# Patient Record
Sex: Female | Born: 1955
Health system: Southern US, Community
[De-identification: ages and names within clinical notes are randomized; demographics above are authoritative.]

## PROBLEM LIST (undated history)

## (undated) DIAGNOSIS — M1990 Unspecified osteoarthritis, unspecified site: Secondary | ICD-10-CM

## (undated) DIAGNOSIS — T4145XA Adverse effect of unspecified anesthetic, initial encounter: Secondary | ICD-10-CM

## (undated) DIAGNOSIS — Z8669 Personal history of other diseases of the nervous system and sense organs: Secondary | ICD-10-CM

## (undated) DIAGNOSIS — Z87442 Personal history of urinary calculi: Secondary | ICD-10-CM

## (undated) DIAGNOSIS — T8859XA Other complications of anesthesia, initial encounter: Secondary | ICD-10-CM

## (undated) DIAGNOSIS — E785 Hyperlipidemia, unspecified: Secondary | ICD-10-CM

## (undated) DIAGNOSIS — I10 Essential (primary) hypertension: Secondary | ICD-10-CM

## (undated) DIAGNOSIS — F329 Major depressive disorder, single episode, unspecified: Secondary | ICD-10-CM

## (undated) DIAGNOSIS — F32A Depression, unspecified: Secondary | ICD-10-CM

## (undated) DIAGNOSIS — B029 Zoster without complications: Secondary | ICD-10-CM

## (undated) DIAGNOSIS — L570 Actinic keratosis: Secondary | ICD-10-CM

## (undated) DIAGNOSIS — Z973 Presence of spectacles and contact lenses: Secondary | ICD-10-CM

## (undated) HISTORY — DX: Major depressive disorder, single episode, unspecified: F32.9

## (undated) HISTORY — PX: BREAST SURGERY: SHX581

## (undated) HISTORY — DX: Actinic keratosis: L57.0

## (undated) HISTORY — DX: Hyperlipidemia, unspecified: E78.5

## (undated) HISTORY — DX: Zoster without complications: B02.9

## (undated) HISTORY — DX: Depression, unspecified: F32.A

## (undated) HISTORY — DX: Essential (primary) hypertension: I10

---

## 2004-10-19 ENCOUNTER — Ambulatory Visit: Payer: Self-pay | Admitting: Unknown Physician Specialty

## 2005-11-07 ENCOUNTER — Ambulatory Visit: Payer: Self-pay | Admitting: Unknown Physician Specialty

## 2006-06-17 HISTORY — PX: REDUCTION MAMMAPLASTY: SUR839

## 2006-07-24 ENCOUNTER — Ambulatory Visit (HOSPITAL_BASED_OUTPATIENT_CLINIC_OR_DEPARTMENT_OTHER): Admission: RE | Admit: 2006-07-24 | Discharge: 2006-07-25 | Payer: Self-pay | Admitting: Plastic Surgery

## 2006-11-18 ENCOUNTER — Ambulatory Visit: Payer: Self-pay | Admitting: Unknown Physician Specialty

## 2007-02-09 ENCOUNTER — Ambulatory Visit: Payer: Self-pay | Admitting: Unknown Physician Specialty

## 2007-04-23 DIAGNOSIS — D239 Other benign neoplasm of skin, unspecified: Secondary | ICD-10-CM

## 2007-04-23 HISTORY — DX: Other benign neoplasm of skin, unspecified: D23.9

## 2007-07-21 LAB — HM COLONOSCOPY: HM Colonoscopy: NORMAL

## 2007-11-19 ENCOUNTER — Ambulatory Visit: Payer: Self-pay | Admitting: Unknown Physician Specialty

## 2008-06-17 HISTORY — PX: CHOLECYSTECTOMY: SHX55

## 2008-12-21 ENCOUNTER — Ambulatory Visit: Payer: Self-pay | Admitting: Unknown Physician Specialty

## 2010-01-01 ENCOUNTER — Ambulatory Visit: Payer: Self-pay | Admitting: Unknown Physician Specialty

## 2010-08-03 ENCOUNTER — Ambulatory Visit: Payer: Self-pay | Admitting: Obstetrics and Gynecology

## 2010-08-12 LAB — HM PAP SMEAR: HM Pap smear: NORMAL

## 2010-11-19 ENCOUNTER — Ambulatory Visit: Payer: Self-pay | Admitting: Obstetrics and Gynecology

## 2011-01-21 ENCOUNTER — Ambulatory Visit: Payer: Self-pay | Admitting: Unknown Physician Specialty

## 2012-03-19 LAB — HM PAP SMEAR: HM Pap smear: NORMAL

## 2012-03-21 LAB — HM PAP SMEAR: HM PAP: NORMAL

## 2012-04-27 ENCOUNTER — Ambulatory Visit: Payer: Self-pay | Admitting: Physician Assistant

## 2012-05-04 LAB — IFOBT (OCCULT BLOOD): IFOBT: NEGATIVE

## 2012-07-20 ENCOUNTER — Encounter: Payer: Self-pay | Admitting: Internal Medicine

## 2012-07-20 ENCOUNTER — Ambulatory Visit (INDEPENDENT_AMBULATORY_CARE_PROVIDER_SITE_OTHER): Payer: Self-pay | Admitting: Internal Medicine

## 2012-07-20 VITALS — BP 112/74 | HR 72 | Temp 98.1°F | Ht 62.25 in | Wt 155.0 lb

## 2012-07-20 DIAGNOSIS — I10 Essential (primary) hypertension: Secondary | ICD-10-CM

## 2012-07-20 DIAGNOSIS — F329 Major depressive disorder, single episode, unspecified: Secondary | ICD-10-CM

## 2012-07-20 DIAGNOSIS — R251 Tremor, unspecified: Secondary | ICD-10-CM

## 2012-07-20 DIAGNOSIS — E785 Hyperlipidemia, unspecified: Secondary | ICD-10-CM

## 2012-07-20 DIAGNOSIS — R259 Unspecified abnormal involuntary movements: Secondary | ICD-10-CM

## 2012-07-20 DIAGNOSIS — F32A Depression, unspecified: Secondary | ICD-10-CM

## 2012-07-20 NOTE — Assessment & Plan Note (Addendum)
Left sided resting tremor. Tremor seems more consistent with benign essential tremor in my opinion than Parkinson's, however there has been some debate about this per her report. Will request records on previous evaluation. Will set up evaluation with Dr. Arbutus Leas in neurology.

## 2012-07-20 NOTE — Assessment & Plan Note (Signed)
Symptoms well controlled with Cymbalta. Will continue.

## 2012-07-20 NOTE — Progress Notes (Signed)
Subjective:    Patient ID: Jennifer Gardner, female    DOB: Nov 06, 1955, 57 y.o.   MRN: [id number]  HPI 57YO female with h/o tremor left hand, hypertension, hyperlipidemia, depression presents to establish care. Doing well. Reports full compliance with meds. Symptoms of depressed mood started after death of her father. Improved with use of Cymbalta.  Notes that she was evaluated by local neurologist for left hand tremor. Initially treated with Primidone with some improvement, but recently told she may actually have Parkinson's.  Was scheduled for additional testing and instructed to change to Sinemet, but has held off on this. Notes occasional resting tremor left hand. Denies change in gait, strength, or any focal symptoms of numbness or other change in sensation. Maternal uncle had Parkinson's.  Outpatient Encounter Prescriptions as of 07/20/2012  Medication Sig Dispense Refill  . acyclovir (ZOVIRAX) 400 MG tablet Take 400 mg by mouth 2 (two) times daily.      . DULoxetine (CYMBALTA) 30 MG capsule Take 30 mg by mouth daily.      Marland Kitchen estradiol (VIVELLE-DOT) 0.05 MG/24HR Place 1 patch onto the skin 2 (two) times a week.      . primidone (MYSOLINE) 250 MG tablet Take 250 mg by mouth 3 (three) times daily.      . progesterone (PROMETRIUM) 100 MG capsule Take 100 mg by mouth daily.      . rosuvastatin (CRESTOR) 20 MG tablet Take 20 mg by mouth daily.      Marland Kitchen triamterene-hydrochlorothiazide (MAXZIDE-25) 37.5-25 MG per tablet Take 1 tablet by mouth daily.       BP 112/74  Pulse 72  Temp 98.1 F (36.7 C) (Oral)  Ht 5' 2.25" (1.581 m)  Wt 155 lb (70.308 kg)  BMI 28.12 kg/m2  SpO2 96%  Review of Systems  Constitutional: Negative for fever, chills, appetite change, fatigue and unexpected weight change.  HENT: Negative for ear pain, congestion, sore throat, trouble swallowing, neck pain, voice change and sinus pressure.   Eyes: Negative for visual disturbance.  Respiratory: Negative for cough, shortness of  breath, wheezing and stridor.   Cardiovascular: Negative for chest pain, palpitations and leg swelling.  Gastrointestinal: Negative for nausea, vomiting, abdominal pain, diarrhea, constipation, blood in stool, abdominal distention and anal bleeding.  Genitourinary: Negative for dysuria and flank pain.  Musculoskeletal: Negative for myalgias, arthralgias and gait problem.  Skin: Negative for color change and rash.  Neurological: Positive for tremors. Negative for dizziness and headaches.  Hematological: Negative for adenopathy. Does not bruise/bleed easily.  Psychiatric/Behavioral: Negative for suicidal ideas, sleep disturbance and dysphoric mood. The patient is not nervous/anxious.        Objective:   Physical Exam  Constitutional: She is oriented to person, place, and time. She appears well-developed and well-nourished. No distress.  HENT:  Head: Normocephalic and atraumatic.  Right Ear: External ear normal.  Left Ear: External ear normal.  Nose: Nose normal.  Mouth/Throat: Oropharynx is clear and moist. No oropharyngeal exudate.  Eyes: Conjunctivae normal are normal. Pupils are equal, round, and reactive to light. Right eye exhibits no discharge. Left eye exhibits no discharge. No scleral icterus.  Neck: Normal range of motion. Neck supple. No tracheal deviation present. No thyromegaly present.  Cardiovascular: Normal rate, regular rhythm, normal heart sounds and intact distal pulses.  Exam reveals no gallop and no friction rub.   No murmur heard. Pulmonary/Chest: Effort normal and breath sounds normal. No respiratory distress. She has no wheezes. She has no rales. She exhibits no  tenderness.  Abdominal: Soft. Bowel sounds are normal. She exhibits no distension. There is no tenderness. There is no rebound and no guarding.  Musculoskeletal: Normal range of motion. She exhibits no edema and no tenderness.  Lymphadenopathy:    She has no cervical adenopathy.  Neurological: She is alert  and oriented to person, place, and time. She has normal strength. She displays tremor (left hand). She displays no atrophy. No cranial nerve deficit or sensory deficit. She exhibits normal muscle tone. Coordination and gait normal.  Skin: Skin is warm and dry. No rash noted. She is not diaphoretic. No erythema. No pallor.  Psychiatric: She has a normal mood and affect. Her behavior is normal. Judgment and thought content normal.          Assessment & Plan:

## 2012-07-20 NOTE — Assessment & Plan Note (Signed)
BP Readings from Last 3 Encounters:  07/20/12 112/74   BP well controlled with current medications. Will continue. Will request results on recent renal function.

## 2012-07-20 NOTE — Assessment & Plan Note (Signed)
Will request records on recent CMP and lipids performed by previous PCP. Follow up 1 month.

## 2012-07-31 ENCOUNTER — Ambulatory Visit: Payer: Self-pay | Admitting: Neurology

## 2012-08-06 ENCOUNTER — Encounter: Payer: Self-pay | Admitting: Neurology

## 2012-08-06 ENCOUNTER — Ambulatory Visit (INDEPENDENT_AMBULATORY_CARE_PROVIDER_SITE_OTHER): Payer: 59 | Admitting: Neurology

## 2012-08-06 VITALS — BP 98/60 | HR 76 | Temp 98.1°F | Resp 18 | Wt 155.0 lb

## 2012-08-06 DIAGNOSIS — G250 Essential tremor: Secondary | ICD-10-CM

## 2012-08-06 DIAGNOSIS — F32A Depression, unspecified: Secondary | ICD-10-CM

## 2012-08-06 DIAGNOSIS — F329 Major depressive disorder, single episode, unspecified: Secondary | ICD-10-CM

## 2012-08-06 DIAGNOSIS — G252 Other specified forms of tremor: Secondary | ICD-10-CM

## 2012-08-06 DIAGNOSIS — G251 Drug-induced tremor: Secondary | ICD-10-CM

## 2012-08-06 MED ORDER — DULOXETINE HCL 20 MG PO CPEP: 20.0000 mg | ORAL_CAPSULE | Freq: Every day | ORAL | Status: DC

## 2012-08-06 MED ORDER — FLUOXETINE HCL 10 MG PO CAPS: 10.0000 mg | ORAL_CAPSULE | Freq: Every day | ORAL | Status: DC

## 2012-08-06 NOTE — Patient Instructions (Addendum)
1.  Decrease cymbalta to 20 mg daily for a month and then stop the medication 2.  Start prozac 10 mg daily.  Call me in a month and let me know if I need to increase the dosage. 3.  I will see you back in 3 months. 4.  Continue with the primidone for now. 5.  Have fun at the beach!

## 2012-08-06 NOTE — Progress Notes (Signed)
Jennifer Gardner was seen today in the movement disorders clinic for neurologic consultation at the request of Shelia Media, MD.  The consultation is for the evaluation of tremor.  She previously saw Dr. Sherryll Burger in [place].  I do not have those records.  The patient reports that she has had tremor for 1 1/2 to 2 years.  She first noted it in her L hand but she occasionally notices it in her R.  She has some quivering in the mouth as well.   She was started on primidone and over the years has worked up to her current 250 mg tid.  She went back to Dr. Sherryll Burger b/c she felt that the primidone was not working as well as it previously had.  When she went back, he thought that she had transitioned from ET to PD.  It was recommended that she try levodopa.  She did this, but did not find it helpful.  She believes that she was on it twice per day and was on it for 90 days.  It was subsequently recommended that she have a DAT scan performed in December 2013.  This turned out to be quite expensive and she was not sure that she wanted to do it, so it has not been done.  The patient states that tremor is worse in the AM.  It is worse with caffeine.  She drinks 1/2 caffeine coffee and she drinks 2 mugs of that in the AM.  She does drink unsweet tea but notices no difference with that.  She does note that a BC powder will make it worse.  Alcohol may improve tremor.  She has a maternal uncle with PD and her uncles son has tremor but it is not PD.  She notices the tremor worse when she is moving the arms.  She notices the mouth tremor with eating and her friends have pointed it out to her.  She is able to eat soup or peas without trouble.  Stress may increase the tremor.  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  She wonders if the cymbalta contributes to the sx.  She tried to wean off of it but felt bad.  She wonders if she could just transition to another antidepressant.  She used  prozac in the past with some success.      Specific Symptoms:  Tremor: yes Voice: no change Sleep: sleeps well (nocturia x 1)  Vivid Dreams:  yes but always has been  Acting out dreams:  no Wet Pillows: no Postural symptoms:  no  Falls?  no Bradykinesia symptoms: no problems Loss of smell:  no Loss of taste:  no Urinary Incontinence:  no Difficulty Swallowing:  no Handwriting, micrographia: no Trouble with ADL's:  no  Trouble buttoning clothing: no Depression:  no, not while on antidepressant Memory changes:  no Hallucinations:  no  visual distortions: no N/V:  no Lightheaded:  no  Syncope: no Diplopia:  no Dyskinesia:  no  Neuroimaging has not previously been performed.    PREVIOUS MEDICATIONS: Sinemet and primidone  ALLERGIES:   Allergies  Allergen Reactions  . Penicillins Hives  . Sulfa Antibiotics Hives    CURRENT MEDICATIONS:  Current Outpatient Prescriptions on File Prior to Visit  Medication Sig Dispense Refill  . acyclovir (ZOVIRAX) 400 MG tablet Take 400 mg by mouth 2 (two) times daily.      . DULoxetine (CYMBALTA) 30 MG capsule Take  30 mg by mouth daily.      Marland Kitchen estradiol (VIVELLE-DOT) 0.05 MG/24HR Place 1 patch onto the skin 2 (two) times a week.      . primidone (MYSOLINE) 250 MG tablet Take 250 mg by mouth 3 (three) times daily.      . progesterone (PROMETRIUM) 100 MG capsule Take 100 mg by mouth daily.      . rosuvastatin (CRESTOR) 20 MG tablet Take 20 mg by mouth daily.      Marland Kitchen triamterene-hydrochlorothiazide (MAXZIDE-25) 37.5-25 MG per tablet Take 1 tablet by mouth daily.       No current facility-administered medications on file prior to visit.    PAST MEDICAL HISTORY:   Past Medical History  Diagnosis Date  . Hyperlipidemia   . Hypertension   . Depression     after death of father 10/31/04  . Shingles     back    PAST SURGICAL HISTORY:   Past Surgical History  Procedure Laterality Date  . Breast surgery      breast reduction  . Foot  surgery      Dr. Orland Jarred    SOCIAL HISTORY:   History   Social History  . Marital Status: Married    Spouse Name: N/A    Number of Children: N/A  . Years of Education: N/A   Occupational History  . Not on file.   Social History Main Topics  . Smoking status: Never Smoker   . Smokeless tobacco: Never Used  . Alcohol Use: Yes     Comment: occassionally  . Drug Use: No  . Sexually Active: Not on file   Other Topics Concern  . Not on file   Social History Narrative   Lives in [place] with husband. Dog in home.      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      Diet - regular      Exercise - 5 days per week    FAMILY HISTORY:   Family Status  Relation Status Death Age  . Mother Alive   . Father Deceased   . Sister Alive   . Maternal Uncle Deceased     ROS:  A complete 10 system review of systems was obtained and was unremarkable apart from what is mentioned above.  PHYSICAL EXAMINATION:    VITALS:   Filed Vitals:   08/06/12 1253  BP: 98/60  Pulse: 76  Temp: 98.1 F (36.7 C)  Resp: 18  Weight: 155 lb (70.308 kg)    GEN:  The patient appears stated age and is in NAD. HEENT:  Normocephalic, atraumatic.  The mucous membranes are moist. The superficial temporal arteries are without ropiness or tenderness. CV:  RRR Lungs:  CTAB Neck/HEME:  There are no carotid bruits bilaterally.  Neurological examination:  Orientation: The patient is alert and oriented x3. Fund of knowledge is appropriate.  Recent and remote memory are intact.  Attention and concentration are normal.    Able to name objects and repeat phrases. Cranial nerves: There is good facial symmetry. Pupils are equal round and reactive to light bilaterally. Fundoscopic exam reveals clear margins bilaterally.  There are no square wave jerks.  Extraocular muscles are intact. The visual fields are full to confrontational testing. The speech is fluent  and clear. Soft palate rises symmetrically and there is no tongue deviation. Hearing is intact to conversational tone. Sensation: Sensation is intact to light and pinprick throughout (facial, trunk, extremities).  Vibration is intact at the bilateral big toe. There is no extinction with double simultaneous stimulation. There is no sensory dermatomal level identified. Motor: Strength is 5/5 in the bilateral upper and lower extremities.   Shoulder shrug is equal and symmetric.  There is no pronator drift.  There are no fasciculations. Deep tendon reflexes: Deep tendon reflexes are 2/4 at the bilateral biceps, triceps, brachioradialis, patella and achilles. Plantar responses are downgoing bilaterally.  Movement examination: Tone: There is normal tone in the bilateral upper extremities.  The tone in the lower extremities is normal.  Abnormal movements: There is a rare, intermittent resting tremor of the LUE.  There is no postural or kinetic tremor.  There is a chin tremor that is prominent when she is concentrating on a task.  She is able to draw concentric circles without significant difficulty.  She can pour water (full glass) from one cup to another without trouble. Coordination:  There is mild decremation with RAM's, seen most prominently with hand opening/closing on the L.  Heel taps and toe taps were good. Gait and Station: The patient has no difficulty arising out of a deep-seated chair without the use of the hands. The patient's stride length is normal.  The patient has a negative pull test.     She ambulates in a tandem fashion without significan trouble.      ASSESSMENT/PLAN:  1.  Tremor.  -Like the patient, I wonder if this is not from cymbalta.  Albeit rare, I have seen cymbalta cause parkinsonism and specifically tremor.  I would like to see her off the medication.  She is agreeable.  We will decrease it to 20 mg daily for one month and then discontinue the medication.  Simultaneously, we  will start Prozac 10 mg daily for mood.  She is to call me in a month to see if I need to increase that dosage.  Risks, benefits, side effects and alternative therapies were discussed.  The opportunity to ask questions was given and they were answered to the best of my ability.  The patient expressed understanding and willingness to follow the outlined treatment protocols.  -for now, I will continue her on the primidone but she is on a very large dosage.  Eventually, I may want to see how she looks on a decreased dosage, but will wait until the Cymbalta is discontinued.  She does tell me that she had recent lab work done at the [facility] and I will try to get a copy.  -I told her that right now I do not want to proceed with a DAT scan.  We can address this in the future, if need be, but first I think I would like to have an MRI before we ever considered DAT scan.  Again, we will hold on these things for now. 2.   I will plan on seeing the patient back in the next 3 months, but encouraged her to call me should any questions or concerns arise before that time.

## 2012-08-26 ENCOUNTER — Telehealth: Payer: Self-pay | Admitting: Internal Medicine

## 2012-08-26 NOTE — Telephone Encounter (Signed)
Pt came in to request refills.  Per pt list:   Maxide 37.5 1 a day Progesterone 100 mg 1 a day Acyclovir 400 mg 2x day Primidone 250 mg 3x day  Please call in refills to [facility] mail order [phone]

## 2012-08-31 MED ORDER — TRIAMTERENE-HCTZ 37.5-25 MG PO TABS: 1.0000 | ORAL_TABLET | Freq: Every day | ORAL | Status: DC

## 2012-08-31 MED ORDER — ACYCLOVIR 400 MG PO TABS: 400.0000 mg | ORAL_TABLET | Freq: Two times a day (BID) | ORAL | Status: DC

## 2012-08-31 MED ORDER — PROGESTERONE MICRONIZED 100 MG PO CAPS: 100.0000 mg | ORAL_CAPSULE | Freq: Every day | ORAL | Status: DC

## 2012-08-31 MED ORDER — PRIMIDONE 250 MG PO TABS: 250.0000 mg | ORAL_TABLET | Freq: Three times a day (TID) | ORAL | Status: DC

## 2012-08-31 NOTE — Telephone Encounter (Signed)
Refills sent

## 2012-09-11 ENCOUNTER — Telehealth: Payer: Self-pay

## 2012-09-11 NOTE — Telephone Encounter (Signed)
Pt in the process of weaning they cymbalta and starting prozac.  When she stopped the cymbalta she "bottomed out"  No energy, depressed, went back on the 20mg  for a couple of days and is now cut back to about 10mg  of cymbalta and increased her prozac to 20mg  and is feeling much better, she is going to cut out the cymbalta this week.  She wanted you to be aware of this.

## 2012-10-15 ENCOUNTER — Ambulatory Visit (INDEPENDENT_AMBULATORY_CARE_PROVIDER_SITE_OTHER): Payer: 59 | Admitting: Neurology

## 2012-10-15 ENCOUNTER — Encounter: Payer: Self-pay | Admitting: Neurology

## 2012-10-15 VITALS — BP 110/68 | HR 72 | Temp 98.1°F | Resp 12 | Ht 62.0 in | Wt 153.0 lb

## 2012-10-15 DIAGNOSIS — F32A Depression, unspecified: Secondary | ICD-10-CM

## 2012-10-15 DIAGNOSIS — F329 Major depressive disorder, single episode, unspecified: Secondary | ICD-10-CM

## 2012-10-15 DIAGNOSIS — R251 Tremor, unspecified: Secondary | ICD-10-CM

## 2012-10-15 DIAGNOSIS — R259 Unspecified abnormal involuntary movements: Secondary | ICD-10-CM

## 2012-10-15 MED ORDER — FLUOXETINE HCL 20 MG PO CAPS: 20.0000 mg | ORAL_CAPSULE | Freq: Every day | ORAL | Status: DC

## 2012-10-15 MED ORDER — PROPRANOLOL HCL ER 80 MG PO CP24: 80.0000 mg | ORAL_CAPSULE | Freq: Every day | ORAL | Status: DC

## 2012-10-15 NOTE — Patient Instructions (Signed)
1.  Start inderal LA - 80 mg daily.  Call me if you have problems or don't feel good on the medication 2.  Call me if you need to go back on cymbalta or want to increase the prozac

## 2012-10-15 NOTE — Progress Notes (Signed)
Jennifer Gardner was seen today in the movement disorders clinic for neurologic f/u.  Her PCP is Wynona Dove, MD.  She is f/u for tremor.  She previously saw Dr. Sherryll Burger in [place].  I do not have those records.  The patient reports that she has had tremor for 1 1/2 to 2 years.  She first noted it in her L hand but she occasionally notices it in her R.  She has some quivering in the mouth as well.   She was started on primidone and over the years has worked up to her current 250 mg tid.  She went back to Dr. Sherryll Burger b/c she felt that the primidone was not working as well as it previously had.  When she went back, he thought that she had transitioned from ET to PD.  It was recommended that she try levodopa.  She did this, but did not find it helpful.  She believes that she was on it twice per day and was on it for 90 days.  It was subsequently recommended that she have a DAT scan performed in December 2013.  This turned out to be quite expensive and she was not sure that she wanted to do it, so it has not been done.  The patient states that tremor is worse in the AM.  It is worse with caffeine.  She drinks 1/2 caffeine coffee and she drinks 2 mugs of that in the AM.  She does drink unsweet tea but notices no difference with that.  She does note that a BC powder will make it worse.  Alcohol may improve tremor.  She has a maternal uncle with PD and her uncles son has tremor but it is not PD.  She notices the tremor worse when she is moving the arms.  She notices the mouth tremor with eating and her friends have pointed it out to her.  She is able to eat soup or peas without trouble.  Stress may increase the tremor.  Last visit, we weaned her off the cymbalta.  She initially had increasing depression with this and we slowed down the wean and increased the prozac.  She still doesn't feel as good as she did on the cymbalta but does not want to go back to it yet.  She has the same amount of tremor.    Specific  Symptoms:  Tremor: yes Voice: no change Sleep: sleeps well (nocturia x 1)  Vivid Dreams:  yes but always has been  Acting out dreams:  no Wet Pillows: no Postural symptoms:  no  Falls?  no Bradykinesia symptoms: no problems Loss of smell:  no Loss of taste:  no Urinary Incontinence:  no Difficulty Swallowing:  no Handwriting, micrographia: no Trouble with ADL's:  no  Trouble buttoning clothing: no Depression:  no, not while on antidepressant Memory changes:  no Hallucinations:  no  visual distortions: no N/V:  no Lightheaded:  no  Syncope: no Diplopia:  no Dyskinesia:  no  Neuroimaging has not previously been performed.    PREVIOUS MEDICATIONS: Sinemet and primidone  ALLERGIES:   Allergies  Allergen Reactions  . Penicillins Hives  . Sulfa Antibiotics Hives    CURRENT MEDICATIONS:  Current Outpatient Prescriptions on File Prior to Visit  Medication Sig Dispense Refill  . acyclovir (ZOVIRAX) 400 MG tablet Take 1 tablet (400 mg total) by mouth 2 (two) times daily.  180 tablet  0  . DULoxetine (CYMBALTA) 20 MG capsule Take 1 capsule (20 mg  total) by mouth daily.  30 capsule  0  . DULoxetine (CYMBALTA) 30 MG capsule Take 30 mg by mouth daily.      Marland Kitchen estradiol (VIVELLE-DOT) 0.05 MG/24HR Place 1 patch onto the skin 2 (two) times a week.      Marland Kitchen FLUoxetine (PROZAC) 10 MG capsule Take 1 capsule (10 mg total) by mouth daily.  30 capsule  3  . primidone (MYSOLINE) 250 MG tablet Take 1 tablet (250 mg total) by mouth 3 (three) times daily.  270 tablet  0  . progesterone (PROMETRIUM) 100 MG capsule Take 1 capsule (100 mg total) by mouth daily.  90 capsule  0  . rosuvastatin (CRESTOR) 20 MG tablet Take 20 mg by mouth daily.      Marland Kitchen triamterene-hydrochlorothiazide (MAXZIDE-25) 37.5-25 MG per tablet Take 1 each (1 tablet total) by mouth daily.  90 tablet  0   No current facility-administered medications on file prior to visit.    PAST MEDICAL HISTORY:   Past Medical History   Diagnosis Date  . Hyperlipidemia   . Hypertension   . Depression     after death of father 11/02/04  . Shingles     back    PAST SURGICAL HISTORY:   Past Surgical History  Procedure Laterality Date  . Breast surgery      breast reduction  . Foot surgery      Dr. Orland Jarred    SOCIAL HISTORY:   History   Social History  . Marital Status: Married    Spouse Name: N/A    Number of Children: N/A  . Years of Education: N/A   Occupational History  . Retired     [employer]   Social History Main Topics  . Smoking status: Never Smoker   . Smokeless tobacco: Never Used  . Alcohol Use: Yes     Comment: occassionally  . Drug Use: No  . Sexually Active: Not on file   Other Topics Concern  . Not on file   Social History Narrative   Lives in [place] with husband. Dog in home.      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      Diet - regular      Exercise - 5 days per week    FAMILY HISTORY:   Family Status  Relation Status Death Age  . Mother Alive     healthy  . Father Deceased     CHF  . Sister Alive     alive  . Maternal Uncle Deceased     PD    ROS:  A complete 10 system review of systems was obtained and was unremarkable apart from what is mentioned above.  PHYSICAL EXAMINATION:    VITALS:   Filed Vitals:   10/15/12 0803  BP: 110/68  Pulse: 72  Temp: 98.1 F (36.7 C)  Resp: 12  Height: 5\' 2"  (1.575 m)  Weight: 153 lb (69.4 kg)    GEN:  The patient appears stated age and is in NAD. HEENT:  Normocephalic, atraumatic.  The mucous membranes are moist. The superficial temporal arteries are without ropiness or tenderness. CV:  RRR Lungs:  CTAB Neck/HEME:  There are no carotid bruits bilaterally.  Neurological examination:  Orientation: The patient is alert and oriented x3. Fund of knowledge is appropriate.  Recent and remote memory are intact.  Attention and concentration are normal.    Able to name  objects and repeat phrases. Cranial  nerves: There is good facial symmetry. Pupils are equal round and reactive to light bilaterally. Fundoscopic exam reveals clear margins bilaterally.  There are no square wave jerks.  Extraocular muscles are intact. The visual fields are full to confrontational testing. The speech is fluent and clear. Soft palate rises symmetrically and there is no tongue deviation. Hearing is intact to conversational tone. Sensation: Sensation is intact to light and pinprick throughout (facial, trunk, extremities). Vibration is intact at the bilateral big toe. There is no extinction with double simultaneous stimulation. There is no sensory dermatomal level identified. Motor: Strength is 5/5 in the bilateral upper and lower extremities.   Shoulder shrug is equal and symmetric.  There is no pronator drift.  There are no fasciculations. Deep tendon reflexes: Deep tendon reflexes are 2/4 at the bilateral biceps, triceps, brachioradialis, patella and achilles. Plantar responses are downgoing bilaterally.  Movement examination: Tone: There is normal tone in the bilateral upper extremities.  The tone in the lower extremities is normal.  Abnormal movements: There is a rare, intermittent resting tremor of the LUE.  There is no postural or kinetic tremor.  There is a chin tremor that is prominent when she is concentrating on a task.  She is able to draw concentric circles without significant difficulty.  She can pour water (full glass) from one cup to another without trouble. Coordination:  There is mild decremation with RAM's, seen most prominently with hand opening/closing on the L.  Heel taps and toe taps were good. Gait and Station: The patient has no difficulty arising out of a deep-seated chair without the use of the hands. The patient's stride length is normal.  The patient has a negative pull test.     She ambulates in a tandem fashion without significant trouble.       ASSESSMENT/PLAN:  1.  Tremor.  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   -I am going to add propranolol.  Risks, benefits, side effects and alternative therapies were discussed.  The opportunity to ask questions was given and they were answered to the best of my ability.  The patient expressed understanding and willingness to follow the outlined treatment protocols.  -for now, I will continue her on the primidone but she is on a very large dosage.  Eventually, I may want to see how she looks on a decreased dosage, but will wait until the Cymbalta is discontinued.  She does tell me that she had recent lab work done at the [facility] and I will try to get a copy.  -I told her that right now I do not want to proceed with a DAT scan.  We can address this in the future, if need be, but first I think I would like to have an MRI before we ever considered DAT scan.  Again, we will hold on these things for now. 2.   Depression.  -She doesn't want to go back to the cymbalta yet and doesn't wish to increase prozac but she will let me know if she changes her mind.   3.  I will plan on seeing the patient back in the next 3 months, but encouraged her to call me should any questions or concerns arise before that time.

## 2012-10-23 ENCOUNTER — Telehealth: Payer: Self-pay

## 2012-10-23 NOTE — Telephone Encounter (Signed)
Pt calling stated she had a bad reaction to the propranolol.  She was on it for four days and her bp dropped pretty low 70's/40's.  She stopped taking them several days ago and is feeling some better.  She said you mentioned some other meds she could try for tremor.  For now she is just continuing on the primidone.

## 2012-10-25 NOTE — Telephone Encounter (Signed)
Tell her lets try the non long acting form of propranolol and we will start at a much lower dose.  Call in propranolol - 20 mg q day for 1 week and then go to bid and then call me after being on it for 2 weeks, sooner if there are problems.

## 2012-10-26 MED ORDER — PROPRANOLOL HCL 20 MG PO TABS: 20.0000 mg | ORAL_TABLET | Freq: Two times a day (BID) | ORAL | Status: DC

## 2012-10-26 NOTE — Telephone Encounter (Signed)
Pt notified, she will start when she gets back in town this Thursday and let us know how she does.

## 2012-11-02 ENCOUNTER — Telehealth: Payer: Self-pay

## 2012-11-02 NOTE — Telephone Encounter (Signed)
IF she is on only prozac - 10 mg then do the following:  Start cymbalta - 30 mg daily and 3 weeks after that d/c the prozac.

## 2012-11-02 NOTE — Telephone Encounter (Signed)
Pt has been on Prozac 20mg  for about two weeks now.  How would you like her to change?

## 2012-11-02 NOTE — Telephone Encounter (Signed)
Pt tried lower does of the propranolol, but still felt very fatigued afterward.  Has also changed from cymbalta to prozac and did not notice any change in her tremor.  She thinks she does better on the cymbalta and would like to go back to it.  Needs to know how to d/c the prozac and restart cymbalta.  She thinks she could try the propranolol again once she is back on the cymbalta.  She just felt better over all on the cymbalta.

## 2012-11-03 MED ORDER — FLUOXETINE HCL 10 MG PO CAPS: 10.0000 mg | ORAL_CAPSULE | Freq: Every day | ORAL | Status: DC

## 2012-11-03 NOTE — Telephone Encounter (Signed)
Pt aware, she only has prozac 20mg  caps so I will send in three weeks worth of 10mg .

## 2012-11-03 NOTE — Telephone Encounter (Signed)
Decrease it to 10 mg now and follow the protocol as I had otherwise given you.  Does she have some 10 mg prozac to titrate down (I think that is the dose she has).  thx

## 2012-11-12 ENCOUNTER — Telehealth: Payer: Self-pay | Admitting: *Deleted

## 2012-11-12 ENCOUNTER — Other Ambulatory Visit: Payer: Self-pay | Admitting: Internal Medicine

## 2012-11-12 NOTE — Telephone Encounter (Signed)
Left message to call back in reference to her prescription refill request.

## 2012-11-13 NOTE — Telephone Encounter (Signed)
Rx went to [facility]

## 2012-12-21 ENCOUNTER — Ambulatory Visit (INDEPENDENT_AMBULATORY_CARE_PROVIDER_SITE_OTHER): Payer: 59 | Admitting: Adult Health

## 2012-12-21 ENCOUNTER — Encounter: Payer: Self-pay | Admitting: Adult Health

## 2012-12-21 VITALS — BP 102/68 | HR 81 | Temp 99.2°F | Resp 12 | Wt 155.0 lb

## 2012-12-21 DIAGNOSIS — J029 Acute pharyngitis, unspecified: Secondary | ICD-10-CM

## 2012-12-21 LAB — POCT RAPID STREP A (OFFICE): Rapid Strep A Screen: NEGATIVE

## 2012-12-21 MED ORDER — CLARITHROMYCIN 250 MG PO TABS: 250.0000 mg | ORAL_TABLET | Freq: Two times a day (BID) | ORAL | Status: DC

## 2012-12-21 NOTE — Assessment & Plan Note (Signed)
Symptoms ongoing greater than one week. Start clarithromycin 250 mg twice a day x10 days. Chloraseptic spray or lozenges for sore throat. Stay hydrated. RTC if symptoms are not improved within 3-4 days.

## 2012-12-21 NOTE — Progress Notes (Signed)
  Subjective:    Patient ID: Jennifer Gardner, female    DOB: January 13, 1956, 57 y.o.   MRN: [id number]  HPI  Patient presents to clinic with cough and fever, congestion. Symptoms began on Friday. She has tried loratidine and ibuprofen for her fever. Highest fever report is 100. She reports multiple contact with strep within the last couple of weeks.   Current Outpatient Prescriptions on File Prior to Visit  Medication Sig Dispense Refill  . acyclovir (ZOVIRAX) 400 MG tablet Take 1 tablet by mouth 2  times daily.  180 tablet  3  . DULoxetine (CYMBALTA) 30 MG capsule Take 30 mg by mouth daily.      Marland Kitchen estradiol (VIVELLE-DOT) 0.05 MG/24HR Place 1 patch onto the skin 2 (two) times a week.      . primidone (MYSOLINE) 250 MG tablet Take 1 tablet  by mouth 3  times daily.  270 tablet  3  . progesterone (PROMETRIUM) 100 MG capsule Take 1 capsule by mouth  daily.  90 capsule  3  . rosuvastatin (CRESTOR) 20 MG tablet Take 20 mg by mouth daily.      Marland Kitchen triamterene-hydrochlorothiazide (MAXZIDE-25) 37.5-25 MG per tablet Take 1 tablet by mouth  daily  90 tablet  3   No current facility-administered medications on file prior to visit.     Review of Systems  Constitutional: Positive for fever and chills.  HENT: Positive for congestion, sore throat, postnasal drip and sinus pressure.   Respiratory: Positive for cough. Negative for wheezing.   Cardiovascular: Negative.     BP 102/68  Pulse 81  Temp(Src) 99.2 F (37.3 C) (Oral)  Resp 12  Wt 155 lb (70.308 kg)  BMI 28.34 kg/m2  SpO2 97%    Objective:   Physical Exam  Constitutional: She is oriented to person, place, and time. She appears well-developed and well-nourished. No distress.  HENT:  Head: Normocephalic and atraumatic.  Right Ear: External ear normal.  Left Ear: External ear normal.  Eyes: Conjunctivae are normal. Pupils are equal, round, and reactive to light.  Neck: Normal range of motion. Neck supple.  Cardiovascular: Normal rate,  regular rhythm, normal heart sounds and intact distal pulses.  Exam reveals no gallop and no friction rub.   No murmur heard. Pulmonary/Chest: Effort normal and breath sounds normal. No respiratory distress. She has no wheezes. She has no rales.  Musculoskeletal: Normal range of motion. She exhibits no edema.  Lymphadenopathy:    She has cervical adenopathy.  Neurological: She is alert and oriented to person, place, and time.  Skin: Skin is warm and dry.  Psychiatric: She has a normal mood and affect. Her behavior is normal. Judgment and thought content normal.       Assessment & Plan:

## 2012-12-21 NOTE — Patient Instructions (Addendum)
  Start clarithromycin 250 mg twice a day for 10 days.  You can try chloraseptic spray to soothe the throat or the lozenges.  Drink plenty of liquids to stay hydrated.  Tylenol or ibuprofen for fever and general discomfort.

## 2012-12-25 ENCOUNTER — Other Ambulatory Visit: Payer: Self-pay | Admitting: Internal Medicine

## 2012-12-28 ENCOUNTER — Other Ambulatory Visit: Payer: Self-pay | Admitting: *Deleted

## 2012-12-29 MED ORDER — ACYCLOVIR 400 MG PO TABS: ORAL_TABLET | ORAL | Status: DC

## 2012-12-29 NOTE — Telephone Encounter (Signed)
Pt states [facility] does not have Rx refill, even though it was confirmed receipt 11/12/12 with multiple refills. Rx sent to pharmacy by escript

## 2013-01-01 ENCOUNTER — Telehealth: Payer: Self-pay | Admitting: *Deleted

## 2013-01-01 MED ORDER — ACYCLOVIR 400 MG PO TABS: ORAL_TABLET | ORAL | Status: DC

## 2013-01-01 NOTE — Telephone Encounter (Signed)
Patient called stating [facility] stated they never received the prescription for her Acyclovir. 30 day supply sent to local pharmacy and once Dr. Dan Humphreys returns would like a 90 day script mailer to her at her home address on file.

## 2013-01-05 MED ORDER — ACYCLOVIR 400 MG PO TABS: ORAL_TABLET | ORAL | Status: DC

## 2013-01-05 NOTE — Telephone Encounter (Signed)
Printed to be signed and mailed

## 2013-01-06 ENCOUNTER — Ambulatory Visit: Payer: 59 | Admitting: Neurology

## 2013-02-01 ENCOUNTER — Telehealth: Payer: Self-pay | Admitting: Internal Medicine

## 2013-02-01 MED ORDER — DULOXETINE HCL 30 MG PO CPEP: 30.0000 mg | ORAL_CAPSULE | Freq: Every day | ORAL | Status: DC

## 2013-02-01 NOTE — Telephone Encounter (Signed)
Patient needs her cmbolta 30 mg called into [facility] on [address] [place] for a 30 day supply she normally uses [facility] who needs a new prescription sent to them as well for 90 day supply.

## 2013-02-01 NOTE — Telephone Encounter (Signed)
Is it ok for refills on Cymbalta?

## 2013-02-01 NOTE — Telephone Encounter (Signed)
Fine to call in these prescriptions

## 2013-02-01 NOTE — Telephone Encounter (Signed)
Rx sent to both pharmacies  

## 2013-02-24 ENCOUNTER — Telehealth: Payer: Self-pay | Admitting: Neurology

## 2013-02-24 NOTE — Telephone Encounter (Signed)
Pt calling to see if perhaps she can come off the primidone.  She doesn't think it is helping much and she thinks it may possibly be leading to ledderhose in her feet and dupytrene's in her hands.  She has a podiatry appt this coming Monday.  But she read where primidone can sometimes be a cause of these conditions.

## 2013-03-01 NOTE — Telephone Encounter (Signed)
I think that we will need an office visit to properly discuss and titrate.  She is on a huge dose of that med and it cannot be stopped cold [place].  Can she come in?

## 2013-03-02 NOTE — Telephone Encounter (Signed)
Left message for Pt to call and schedule an office visit to discuss medication issues.

## 2013-03-05 ENCOUNTER — Ambulatory Visit (INDEPENDENT_AMBULATORY_CARE_PROVIDER_SITE_OTHER): Payer: 59 | Admitting: Neurology

## 2013-03-05 VITALS — BP 112/68 | HR 60 | Temp 97.6°F | Resp 20 | Wt 150.7 lb

## 2013-03-05 DIAGNOSIS — F329 Major depressive disorder, single episode, unspecified: Secondary | ICD-10-CM

## 2013-03-05 DIAGNOSIS — F32A Depression, unspecified: Secondary | ICD-10-CM

## 2013-03-05 DIAGNOSIS — R251 Tremor, unspecified: Secondary | ICD-10-CM

## 2013-03-05 DIAGNOSIS — R259 Unspecified abnormal involuntary movements: Secondary | ICD-10-CM

## 2013-03-05 MED ORDER — PRIMIDONE 50 MG PO TABS: 100.0000 mg | ORAL_TABLET | Freq: Every day | ORAL | Status: DC

## 2013-03-05 NOTE — Addendum Note (Signed)
Addended by: Sadie Haber D on: 03/05/2013 10:34 AM   Modules accepted: Orders

## 2013-03-05 NOTE — Patient Instructions (Addendum)
1.  We will get your DaT scan scheduled.  Call Sarah if you don't have the date/time for that in the next 2-3 weeks 2.  Decrease primidone as follows:  100 mg daily for 1 week, then 50 mg daily for 2 weeks and then stop the medication 3.  Make a follow up appointment for late January or early February on your way out today. Thank You!

## 2013-03-05 NOTE — Progress Notes (Signed)
Jennifer Gardner was seen today in the movement disorders clinic for neurologic f/u.  Her PCP is Wynona Dove, MD.  She is f/u for tremor.  She previously saw Dr. Sherryll Burger in [place].  I do not have those records.  The patient reports that she has had tremor for 1 1/2 to 2 years.  She first noted it in her L hand but she occasionally notices it in her R.  She has some quivering in the mouth as well.   She was started on primidone and over the years has worked up to her current 250 mg tid.  She went back to Dr. Sherryll Burger b/c she felt that the primidone was not working as well as it previously had.  When she went back, he thought that she had transitioned from ET to PD.  It was recommended that she try levodopa.  She did this, but did not find it helpful.  She believes that she was on it twice per day and was on it for 90 days.  It was subsequently recommended that she have a DAT scan performed in December 2013.  This turned out to be quite expensive and she was not sure that she wanted to do it, so it has not been done.  The patient states that tremor is worse in the AM.  It is worse with caffeine.  She drinks 1/2 caffeine coffee and she drinks 2 mugs of that in the AM.  She does drink unsweet tea but notices no difference with that.  She does note that a BC powder will make it worse.  Alcohol may improve tremor.  She has a maternal uncle with PD and her uncles son has tremor but it is not PD.  She notices the tremor worse when she is moving the arms.  She notices the mouth tremor with eating and her friends have pointed it out to her.  She is able to eat soup or peas without trouble.  Stress may increase the tremor.  Last visit, we weaned her off the cymbalta.  She initially had increasing depression with this and we slowed down the wean and increased the prozac.  She still doesn't feel as good as she did on the cymbalta but does not want to go back to it yet.  She has the same amount of tremor.   03/05/13  update:  Last visit, propranolol was added.  However, this caused her blood pressure to drop.  The patient did call because we had changed her Cymbalta to Prozac as she thought that this causes tremor.  However, once she got off of the Cymbalta, the tremor was no better, depression was worse and she ultimately went back to the Cymbalta.  Today, she states that she would like to go off of the primidone.  She thinks that it contributes or causes her Dupuytren's contractures.  She was on a very large dosage of primidone at 250 mg 3 times per day and she self weaned it and is now on 250 mg once per day.  She noted no change in the tremor.  The tremor is most bothersome in the AM.  Today she notes that while she knew her maternal uncle had PD, his sons (her cousin) dx was recently changed to PD.  Neuroimaging has not previously been performed.    PREVIOUS MEDICATIONS: Sinemet and primidone  ALLERGIES:   Allergies  Allergen Reactions  . Penicillins Hives  . Sulfa Antibiotics Hives    CURRENT MEDICATIONS:  Current Outpatient Prescriptions on File Prior to Visit  Medication Sig Dispense Refill  . acyclovir (ZOVIRAX) 400 MG tablet Take 1 tablet by mouth 2  times daily.  180 tablet  1  . DULoxetine (CYMBALTA) 30 MG capsule Take 1 capsule (30 mg total) by mouth daily.  90 capsule  0  . estradiol (VIVELLE-DOT) 0.05 MG/24HR Place 1 patch onto the skin 2 (two) times a week.      . primidone (MYSOLINE) 250 MG tablet Take 1 tablet  by mouth 3  times daily.  270 tablet  3  . progesterone (PROMETRIUM) 100 MG capsule Take 1 capsule by mouth  daily.  90 capsule  3  . rosuvastatin (CRESTOR) 20 MG tablet Take 20 mg by mouth daily.      Marland Kitchen triamterene-hydrochlorothiazide (MAXZIDE-25) 37.5-25 MG per tablet Take 1 tablet by mouth  daily  90 tablet  3   No current facility-administered medications on file prior to visit.    PAST MEDICAL HISTORY:   Past Medical History  Diagnosis Date  . Hyperlipidemia   .  Hypertension   . Depression     after death of father 2004/11/09  . Shingles     back    PAST SURGICAL HISTORY:   Past Surgical History  Procedure Laterality Date  . Breast surgery      breast reduction  . Foot surgery      Dr. Orland Jarred    SOCIAL HISTORY:   History   Social History  . Marital Status: Married    Spouse Name: N/A    Number of Children: N/A  . Years of Education: N/A   Occupational History  . Retired     [employer]   Social History Main Topics  . Smoking status: Never Smoker   . Smokeless tobacco: Never Used  . Alcohol Use: Yes     Comment: occassionally  . Drug Use: No  . Sexual Activity: Not on file   Other Topics Concern  . Not on file   Social History Narrative   Lives in [place] with husband. Dog in home.      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      Diet - regular      Exercise - 5 days per week    FAMILY HISTORY:   Family Status  Relation Status Death Age  . Mother Alive     healthy  . Father Deceased     CHF  . Sister Alive     alive  . Maternal Uncle Deceased     PD    ROS:  A complete 10 system review of systems was obtained and was unremarkable apart from what is mentioned above.  PHYSICAL EXAMINATION:    VITALS:   Filed Vitals:   03/05/13 0909  BP: 112/68  Pulse: 60  Temp: 97.6 F (36.4 C)  Resp: 20  Weight: 150 lb 11.2 oz (68.357 kg)    GEN:  The patient appears stated age and is in NAD. HEENT:  Normocephalic, atraumatic.  The mucous membranes are moist. The superficial temporal arteries are without ropiness or tenderness. CV:  RRR Lungs:  CTAB Neck/HEME:  There are no carotid bruits bilaterally.  Neurological examination:  Orientation: The patient is alert and oriented x3. Fund of knowledge is appropriate.  Recent and remote memory are intact.  Attention and concentration are normal.    Able to name objects and repeat phrases. Cranial nerves: There is  good  facial symmetry. Pupils are equal round and reactive to light bilaterally. Fundoscopic exam reveals clear margins bilaterally.  There are no square wave jerks.  Extraocular muscles are intact. The visual fields are full to confrontational testing. The speech is fluent and clear. Soft palate rises symmetrically and there is no tongue deviation. Hearing is intact to conversational tone. Sensation: Sensation is intact to light and pinprick throughout (facial, trunk, extremities). Vibration is intact at the bilateral big toe. There is no extinction with double simultaneous stimulation. There is no sensory dermatomal level identified. Motor: Strength is 5/5 in the bilateral upper and lower extremities.   Shoulder shrug is equal and symmetric.  There is no pronator drift.  There are no fasciculations. Deep tendon reflexes: Deep tendon reflexes are 2/4 at the bilateral biceps, triceps, brachioradialis, patella and achilles. Plantar responses are downgoing bilaterally.  Movement examination: Tone: There is normal tone in the bilateral upper extremities.  The tone in the lower extremities is normal.  Abnormal movements: There is no tremor today.  There is no postural or kinetic tremor.  There is a chin tremor that is prominent when she is concentrating on a task.  She is able to draw concentric circles without significant difficulty.  She can pour water (full glass) from one cup to another without trouble. Coordination:  There is mild decremation with RAM's, seen most prominently with hand opening/closing, alternating supination/pronation of the forearm and finger taps on the L.  Heel taps and toe taps were good. Gait and Station: The patient has no difficulty arising out of a deep-seated chair without the use of the hands. The patient's stride length is normal.  The patient has a negative pull test.     She ambulates in a tandem fashion without significant trouble.      ASSESSMENT/PLAN:  1.  Tremor.  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   -She wants to d/c the primidone and I gave her a weaning schedule.  -I gave her several options (medication options as well) and we ultimately decided to go ahead and get the DaT scan (at [facility]).  If negative, then we should try artane or topamax, but she has little to no tremor today. 2.   Depression.  -She is doing well back on the cymbalta 3.  I will plan on seeing the patient back in the next 4 months, but encouraged her to call me should any questions or concerns arise before that time.

## 2013-04-21 LAB — HM MAMMOGRAPHY

## 2013-04-26 ENCOUNTER — Telehealth: Payer: Self-pay | Admitting: Internal Medicine

## 2013-04-26 NOTE — Telephone Encounter (Signed)
Pt called to schedule cpx and stated she needs refill on crestor 20mg  take 1 daily [facility] rx Pt has 3 weeks left,

## 2013-04-27 MED ORDER — ROSUVASTATIN CALCIUM 20 MG PO TABS: 20.0000 mg | ORAL_TABLET | Freq: Every day | ORAL | Status: DC

## 2013-04-27 NOTE — Telephone Encounter (Signed)
Medication was sent to pharmacy with notation patient must keep scheduled appointment.

## 2013-05-12 ENCOUNTER — Ambulatory Visit: Payer: Self-pay | Admitting: Internal Medicine

## 2013-06-03 ENCOUNTER — Encounter: Payer: Self-pay | Admitting: Internal Medicine

## 2013-06-17 HISTORY — PX: FOOT SURGERY: SHX648

## 2013-06-21 ENCOUNTER — Encounter: Payer: Self-pay | Admitting: Internal Medicine

## 2013-06-21 ENCOUNTER — Ambulatory Visit (INDEPENDENT_AMBULATORY_CARE_PROVIDER_SITE_OTHER): Payer: 59 | Admitting: Internal Medicine

## 2013-06-21 VITALS — BP 110/72 | HR 71 | Temp 98.4°F | Ht 62.25 in | Wt 159.0 lb

## 2013-06-21 DIAGNOSIS — Z0000 Encounter for general adult medical examination without abnormal findings: Secondary | ICD-10-CM | POA: Insufficient documentation

## 2013-06-21 DIAGNOSIS — I10 Essential (primary) hypertension: Secondary | ICD-10-CM

## 2013-06-21 DIAGNOSIS — E785 Hyperlipidemia, unspecified: Secondary | ICD-10-CM

## 2013-06-21 DIAGNOSIS — F329 Major depressive disorder, single episode, unspecified: Secondary | ICD-10-CM

## 2013-06-21 DIAGNOSIS — F3289 Other specified depressive episodes: Secondary | ICD-10-CM

## 2013-06-21 DIAGNOSIS — F32A Depression, unspecified: Secondary | ICD-10-CM

## 2013-06-21 DIAGNOSIS — R259 Unspecified abnormal involuntary movements: Secondary | ICD-10-CM

## 2013-06-21 DIAGNOSIS — R251 Tremor, unspecified: Secondary | ICD-10-CM

## 2013-06-21 LAB — CBC WITH DIFFERENTIAL/PLATELET
BASOS ABS: 0 10*3/uL (ref 0.0–0.1)
Basophils Relative: 0.5 % (ref 0.0–3.0)
Eosinophils Absolute: 0.1 10*3/uL (ref 0.0–0.7)
Eosinophils Relative: 1.9 % (ref 0.0–5.0)
HCT: 42 % (ref 36.0–46.0)
Hemoglobin: 13.9 g/dL (ref 12.0–15.0)
LYMPHS ABS: 2.3 10*3/uL (ref 0.7–4.0)
Lymphocytes Relative: 39.7 % (ref 12.0–46.0)
MCHC: 33.2 g/dL (ref 30.0–36.0)
MCV: 87.7 fl (ref 78.0–100.0)
MONOS PCT: 6.9 % (ref 3.0–12.0)
Monocytes Absolute: 0.4 10*3/uL (ref 0.1–1.0)
Neutro Abs: 2.9 10*3/uL (ref 1.4–7.7)
Neutrophils Relative %: 51 % (ref 43.0–77.0)
PLATELETS: 243 10*3/uL (ref 150.0–400.0)
RBC: 4.79 Mil/uL (ref 3.87–5.11)
RDW: 13.1 % (ref 11.5–14.6)
WBC: 5.7 10*3/uL (ref 4.5–10.5)

## 2013-06-21 LAB — COMPREHENSIVE METABOLIC PANEL
ALT: 23 U/L (ref 0–35)
AST: 24 U/L (ref 0–37)
Albumin: 4.4 g/dL (ref 3.5–5.2)
Alkaline Phosphatase: 41 U/L (ref 39–117)
BILIRUBIN TOTAL: 0.5 mg/dL (ref 0.3–1.2)
BUN: 14 mg/dL (ref 6–23)
CALCIUM: 9 mg/dL (ref 8.4–10.5)
CHLORIDE: 101 meq/L (ref 96–112)
CO2: 29 mEq/L (ref 19–32)
Creatinine, Ser: 0.6 mg/dL (ref 0.4–1.2)
GFR: 111.67 mL/min (ref >60.00)
Glucose, Bld: 92 mg/dL (ref 70–99)
Potassium: 3.9 mEq/L (ref 3.5–5.1)
Sodium: 137 mEq/L (ref 135–145)
Total Protein: 6.8 g/dL (ref 6.0–8.3)

## 2013-06-21 LAB — LIPID PANEL
CHOL/HDL RATIO: 3
Cholesterol: 188 mg/dL (ref 0–200)
HDL: 64.7 mg/dL (ref >39.00)
LDL CALC: 103 mg/dL — AB (ref 0–99)
TRIGLYCERIDES: 100 mg/dL (ref 0.0–149.0)
VLDL: 20 mg/dL (ref 0.0–40.0)

## 2013-06-21 LAB — TSH: TSH: 0.84 u[IU]/mL (ref 0.35–5.50)

## 2013-06-21 MED ORDER — ACYCLOVIR 400 MG PO TABS: ORAL_TABLET | ORAL | Status: DC

## 2013-06-21 MED ORDER — ESTRADIOL 0.05 MG/24HR TD PTTW: 1.0000 | MEDICATED_PATCH | TRANSDERMAL | Status: DC

## 2013-06-21 MED ORDER — DULOXETINE HCL 30 MG PO CPEP: 30.0000 mg | ORAL_CAPSULE | Freq: Every day | ORAL | Status: DC

## 2013-06-21 MED ORDER — ROSUVASTATIN CALCIUM 20 MG PO TABS: 20.0000 mg | ORAL_TABLET | Freq: Every day | ORAL | Status: DC

## 2013-06-21 MED ORDER — PROGESTERONE MICRONIZED 100 MG PO CAPS: ORAL_CAPSULE | ORAL | Status: DC

## 2013-06-21 MED ORDER — TRIAMTERENE-HCTZ 37.5-25 MG PO TABS: ORAL_TABLET | ORAL | Status: DC

## 2013-06-21 NOTE — Assessment & Plan Note (Signed)
BP Readings from Last 3 Encounters:  06/21/13 110/72  03/05/13 112/68  12/21/12 102/68   BP well controlled on current medications. Will continue.

## 2013-06-21 NOTE — Assessment & Plan Note (Signed)
Will check lipids and LFTs with labs today. Continue Crestor. 

## 2013-06-21 NOTE — Progress Notes (Signed)
Subjective:    Patient ID: Jennifer Gardner, female    DOB: [birth year], 58 y.o.   MRN: [id number]  HPI 58 year old female with history of hypertension, hyperlipidemia and tremor presents for annual exam. She reports she is generally feeling well. No concerns today. She notes that she was seen by neurologist and question still remains whether her tremors secondary to benign essential tremor versus parkinsonism. She is scheduled to undergo DAT scan at [facility] tomorrow. She then has scheduled followup with her neurologist.  Outpatient Prescriptions Prior to Visit  Medication Sig Dispense Refill  . acyclovir (ZOVIRAX) 400 MG tablet Take 1 tablet by mouth 2  times daily.  180 tablet  1  . DULoxetine (CYMBALTA) 30 MG capsule Take 1 capsule (30 mg total) by mouth daily.  90 capsule  0  . estradiol (VIVELLE-DOT) 0.05 MG/24HR Place 1 patch onto the skin 2 (two) times a week.      . progesterone (PROMETRIUM) 100 MG capsule Take 1 capsule by mouth  daily.  90 capsule  3  . rosuvastatin (CRESTOR) 20 MG tablet Take 1 tablet (20 mg total) by mouth daily.  90 tablet  0  . triamterene-hydrochlorothiazide (MAXZIDE-25) 37.5-25 MG per tablet Take 1 tablet by mouth  daily  90 tablet  3  . primidone (MYSOLINE) 50 MG tablet Take 2 tablets (100 mg total) by mouth at bedtime.  60 tablet  2   No facility-administered medications prior to visit.   BP 110/72  Pulse 71  Temp(Src) 98.4 F (36.9 C) (Oral)  Ht 5' 2.25" (1.581 m)  Wt 159 lb (72.122 kg)  BMI 28.85 kg/m2  SpO2 97%  Review of Systems  Constitutional: Negative for fever, chills, appetite change, fatigue and unexpected weight change.  HENT: Negative for congestion, ear pain, sinus pressure, sore throat, trouble swallowing and voice change.   Eyes: Negative for visual disturbance.  Respiratory: Negative for cough, shortness of breath, wheezing and stridor.   Cardiovascular: Negative for chest pain, palpitations and leg swelling.  Gastrointestinal:  Negative for nausea, vomiting, abdominal pain, diarrhea, constipation, blood in stool, abdominal distention and anal bleeding.  Genitourinary: Negative for dysuria and flank pain.  Musculoskeletal: Negative for arthralgias, gait problem, myalgias and neck pain.  Skin: Negative for color change and rash.  Neurological: Positive for tremors. Negative for dizziness and headaches.  Hematological: Negative for adenopathy. Does not bruise/bleed easily.  Psychiatric/Behavioral: Negative for suicidal ideas, sleep disturbance and dysphoric mood. The patient is not nervous/anxious.        Objective:   Physical Exam  Constitutional: She is oriented to person, place, and time. She appears well-developed and well-nourished. No distress.  HENT:  Head: Normocephalic and atraumatic.  Right Ear: External ear normal.  Left Ear: External ear normal.  Nose: Nose normal.  Mouth/Throat: Oropharynx is clear and moist. No oropharyngeal exudate.  Eyes: Conjunctivae are normal. Pupils are equal, round, and reactive to light. Right eye exhibits no discharge. Left eye exhibits no discharge. No scleral icterus.  Neck: Normal range of motion. Neck supple. No tracheal deviation present. No thyromegaly present.  Cardiovascular: Normal rate, regular rhythm, normal heart sounds and intact distal pulses.  Exam reveals no gallop and no friction rub.   No murmur heard. Pulmonary/Chest: Effort normal and breath sounds normal. No accessory muscle usage. Not tachypneic. No respiratory distress. She has no decreased breath sounds. She has no wheezes. She has no rhonchi. She has no rales. She exhibits no tenderness. Right breast exhibits no inverted  nipple, no mass, no nipple discharge, no skin change and no tenderness. Left breast exhibits no inverted nipple, no mass, no nipple discharge, no skin change and no tenderness. Breasts are symmetrical.    Abdominal: Soft. Bowel sounds are normal. She exhibits no distension and no mass.  There is no tenderness. There is no rebound and no guarding.  Musculoskeletal: Normal range of motion. She exhibits no edema and no tenderness.  Lymphadenopathy:    She has no cervical adenopathy.  Neurological: She is alert and oriented to person, place, and time. No cranial nerve deficit. She exhibits normal muscle tone. Coordination normal.  Skin: Skin is warm and dry. No rash noted. She is not diaphoretic. No erythema. No pallor.  Psychiatric: She has a normal mood and affect. Her behavior is normal. Judgment and thought content normal.          Assessment & Plan:

## 2013-06-21 NOTE — Assessment & Plan Note (Signed)
Symptoms well controlled on Cymbalta. Will continue.

## 2013-06-21 NOTE — Assessment & Plan Note (Signed)
General medical exam including breast exam normal today. Pap and pelvic deferred as Pap normal 2013, plan repeat 2016. Mammogram up-to-date. Colonoscopy up-to-date. Encouraged healthy diet and regular exercise. Influenza vaccine declined. Discussed Zostavax and Prevnar. Will check labs including CBC, CMP, lipids today.

## 2013-06-21 NOTE — Progress Notes (Signed)
Pre-visit discussion using our clinic review tool. No additional management support is needed unless otherwise documented below in the visit note.  

## 2013-06-21 NOTE — Assessment & Plan Note (Signed)
Reviewed notes from Radiology. Benign essential tremor verus Parkinson's. DAT scan pending for tomorrow. Will follow.

## 2013-06-23 ENCOUNTER — Encounter: Payer: Self-pay | Admitting: *Deleted

## 2013-06-25 ENCOUNTER — Ambulatory Visit (INDEPENDENT_AMBULATORY_CARE_PROVIDER_SITE_OTHER): Payer: 59 | Admitting: Neurology

## 2013-06-25 ENCOUNTER — Encounter: Payer: Self-pay | Admitting: Neurology

## 2013-06-25 VITALS — BP 114/78 | HR 68 | Temp 97.4°F | Resp 12 | Ht 62.0 in | Wt 159.8 lb

## 2013-06-25 DIAGNOSIS — F3289 Other specified depressive episodes: Secondary | ICD-10-CM

## 2013-06-25 DIAGNOSIS — G250 Essential tremor: Secondary | ICD-10-CM

## 2013-06-25 DIAGNOSIS — G252 Other specified forms of tremor: Principal | ICD-10-CM

## 2013-06-25 DIAGNOSIS — F32A Depression, unspecified: Secondary | ICD-10-CM

## 2013-06-25 DIAGNOSIS — F329 Major depressive disorder, single episode, unspecified: Secondary | ICD-10-CM

## 2013-06-25 MED ORDER — TOPIRAMATE 25 MG PO TABS: 25.0000 mg | ORAL_TABLET | ORAL | Status: DC

## 2013-06-25 MED ORDER — TOPIRAMATE 100 MG PO TABS: 100.0000 mg | ORAL_TABLET | Freq: Every day | ORAL | Status: DC

## 2013-06-25 NOTE — Progress Notes (Signed)
Jennifer Gardner was seen today in the movement disorders clinic for neurologic f/u.  Her PCP is Rica Mast, MD.  She is f/u for tremor.  She [name]  I do not have those records.  The patient reports that she has had tremor for 1 1/2 to 2 years.  She first noted it in her L hand but she occasionally notices it in her R.  She has some quivering in the mouth as well.   She was started on primidone and over the years has worked up to her current 250 mg tid.  She went back to [name] b/c she felt that the primidone was not working as well as it previously had.  When she went back, he thought that she had transitioned from ET to PD.  It was recommended that she try levodopa.  She did this, but did not find it helpful.  She believes that she was on it twice per day and was on it for 90 days.  It was subsequently recommended that she have a DAT scan performed in December 2013.  This turned out to be quite expensive and she was not sure that she wanted to do it, so it has not been done.  The patient states that tremor is worse in the AM.  It is worse with caffeine.  She drinks 1/2 caffeine coffee and she drinks 2 mugs of that in the AM.  She does drink unsweet tea but notices no difference with that.  She does note that a BC powder will make it worse.  Alcohol may improve tremor.  She has a maternal uncle with PD and her uncles son has tremor but it is not PD.  She notices the tremor worse when she is moving the arms.  She notices the mouth tremor with eating and her friends have pointed it out to her.  She is able to eat soup or peas without trouble.  Stress may increase the tremor.  Last visit, we weaned her off the cymbalta.  She initially had increasing depression with this and we slowed down the wean and increased the prozac.  She still doesn't feel as good as she did on the cymbalta but does not want to go back to it yet.  She has the same amount of tremor.   03/05/13  update:  Last visit, propranolol was added.  However, this caused her blood pressure to drop.  The patient did call because we had changed her Cymbalta to Prozac as she thought that this causes tremor.  However, once she got off of the Cymbalta, the tremor was no better, depression was worse and she ultimately went back to the Cymbalta.  Today, she states that she would like to go off of the primidone.  She thinks that it contributes or causes her Dupuytren's contractures.  She was on a very large dosage of primidone at 250 mg 3 times per day and she self weaned it and is now on 250 mg once per day.  She noted no change in the tremor.  The tremor is most bothersome in the AM.  Today she notes that while she knew her maternal uncle had PD, his sons (her cousin) dx was recently changed to PD.  06/25/13 update:  The pt has tremor.  Since last visit she has had a DaT scan that was normal.  She had weaned herself off of primidone and tremor got worse but not significantly so.  She does know that she is not as groggy as previously.  She is on cymbalta and doing well in that regard.  Neuroimaging has not previously been performed.    PREVIOUS MEDICATIONS: Sinemet and primidone  ALLERGIES:   Allergies  Allergen Reactions  . Penicillins Hives  . Sulfa Antibiotics Hives    CURRENT MEDICATIONS:  Current Outpatient Prescriptions on File Prior to Visit  Medication Sig Dispense Refill  . acyclovir (ZOVIRAX) 400 MG tablet Take 1 tablet by mouth 2  times daily.  180 tablet  1  . DULoxetine (CYMBALTA) 30 MG capsule Take 1 capsule (30 mg total) by mouth daily.  90 capsule  3  . estradiol (VIVELLE-DOT) 0.05 MG/24HR patch Place 1 patch (0.05 mg total) onto the skin 2 (two) times a week.  24 patch  3  . progesterone (PROMETRIUM) 100 MG capsule Take 1 capsule by mouth  daily.  90 capsule  3  . rosuvastatin (CRESTOR) 20 MG tablet Take 1 tablet (20 mg total) by mouth daily.  90 tablet  3  .  triamterene-hydrochlorothiazide (MAXZIDE-25) 37.5-25 MG per tablet Take 1 tablet by mouth  daily  90 tablet  3   No current facility-administered medications on file prior to visit.    PAST MEDICAL HISTORY:   Past Medical History  Diagnosis Date  . Hyperlipidemia   . Hypertension   . Depression     after death of father 30-Sep-2004  . Shingles     back    PAST SURGICAL HISTORY:   Past Surgical History  Procedure Laterality Date  . Breast surgery      breast reduction  . Foot surgery      Dr. Elvina Mattes    SOCIAL HISTORY:   History   Social History  . Marital Status: Married    Spouse Name: N/A    Number of Children: N/A  . Years of Education: N/A   Occupational History  . Retired     [employer]   Social History Main Topics  . Smoking status: Never Smoker   . Smokeless tobacco: Never Used  . Alcohol Use: Yes     Comment: occassionally  . Drug Use: No  . Sexual Activity: Not on file   Other Topics Concern  . Not on file   Social History Narrative   Lives in [place] with husband. Dog in home.      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      Diet - regular      Exercise - 5 days per week 42min    FAMILY HISTORY:   Family Status  Relation Status Death Age  . Mother Alive     healthy  . Father Deceased     CHF  . Sister Alive     alive  . Maternal Uncle Deceased     PD    ROS:  A complete 10 system review of systems was obtained and was unremarkable apart from what is mentioned above.  PHYSICAL EXAMINATION:    VITALS:   Filed Vitals:   06/25/13 1236  BP: 114/78  Pulse: 68  Temp: 97.4 F (36.3 C)  Resp: 12  Height: 5\' 2"  (1.575 m)  Weight: 159 lb 12.8 oz (72.485 kg)    GEN:  The patient appears stated age and is in NAD. HEENT:  Normocephalic, atraumatic.  The mucous membranes are moist. The superficial temporal arteries are without ropiness or tenderness. CV:  RRR Lungs:  CTAB Neck/HEME:  There  are no carotid bruits bilaterally.  Neurological examination:  Orientation: The patient is alert and oriented x3. Fund of knowledge is appropriate.  Recent and remote memory are intact.  Attention and concentration are normal.    Able to name objects and repeat phrases. Cranial nerves: There is good facial symmetry. Pupils are equal round and reactive to light bilaterally. Fundoscopic exam reveals clear margins bilaterally.  There are no square wave jerks.  Extraocular muscles are intact. The visual fields are full to confrontational testing. The speech is fluent and clear. Soft palate rises symmetrically and there is no tongue deviation. Hearing is intact to conversational tone. Sensation: Sensation is intact to light and pinprick throughout (facial, trunk, extremities). Vibration is intact at the bilateral big toe. There is no extinction with double simultaneous stimulation. There is no sensory dermatomal level identified. Motor: Strength is 5/5 in the bilateral upper and lower extremities.   Shoulder shrug is equal and symmetric.  There is no pronator drift.  There are no fasciculations. Deep tendon reflexes: Deep tendon reflexes are 2/4 at the bilateral biceps, triceps, brachioradialis, patella and achilles. Plantar responses are downgoing bilaterally.  Movement examination: Tone: There is normal tone in the bilateral upper extremities.  The tone in the lower extremities is normal.  Abnormal movements: There is no tremor today.  There is no postural or kinetic tremor.  There is a chin tremor that is prominent when she is concentrating on a task.  She is able to draw concentric circles without significant difficulty.  She can pour water (full glass) from one cup to another without trouble. Coordination:  There is mild decremation with RAM's, seen most prominently with hand opening/closing, alternating supination/pronation of the forearm and finger taps on the L.  Heel taps and toe taps were  good. Gait and Station: The patient has no difficulty arising out of a deep-seated chair without the use of the hands. The patient's stride length is normal.  The patient has a negative pull test.     She ambulates in a tandem fashion without significant trouble.      ASSESSMENT/PLAN:  1.  Tremor.  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   DaT scan was done and was negative.  -We talked about other meds for tremor (artane, topamax) and decided on topamax.  The patient was given a RX for Topamax, 25 mg.  The patient will take 1 tablet daily for 1 week and then increase to 2 tablets daily for a week and then increase to 3 tablets daily for 1 week and then will increase to one 100mg  tablet daily thereafter. R/B/SE were discussed in detail and the patient expressed understanding.  The patient will call with questions/concerns.  There is no history of nephrolithiasis. 2.   Depression.  -She is doing well back on the cymbalta 3.  I will plan on seeing the patient back in the next 4 months, but encouraged her to call me should any questions or concerns arise before that time.

## 2013-06-25 NOTE — Patient Instructions (Signed)
1.  Start topamax -  The patient will take 1 tablet daily for 1 week and then increase to 2 tablets daily for a week and then increase to 3 tablets daily for 1 week and then will increase to one 100mg  tablet daily thereafter.

## 2013-06-28 ENCOUNTER — Ambulatory Visit: Payer: 59 | Admitting: Neurology

## 2013-07-05 ENCOUNTER — Telehealth: Payer: Self-pay | Admitting: Internal Medicine

## 2013-07-05 NOTE — Telephone Encounter (Signed)
Pt dropped off paperwork 1/19 re: history and physical for upcoming surgery.  Asking if this can be filled out and ready for pick up by Wednesday.  States she needs to take it with her on Thursday.  Please contact pt when ready for pickup.

## 2013-07-06 NOTE — Telephone Encounter (Signed)
Left message on patient voicemail informing her form has been completed and ready for pick up. Form upfront for pick up

## 2013-07-08 ENCOUNTER — Ambulatory Visit: Payer: Self-pay | Admitting: Podiatry

## 2013-07-16 ENCOUNTER — Ambulatory Visit: Payer: 59 | Admitting: Neurology

## 2013-07-22 ENCOUNTER — Ambulatory Visit (INDEPENDENT_AMBULATORY_CARE_PROVIDER_SITE_OTHER): Payer: 59 | Admitting: Adult Health

## 2013-07-22 ENCOUNTER — Encounter (INDEPENDENT_AMBULATORY_CARE_PROVIDER_SITE_OTHER): Payer: Self-pay

## 2013-07-22 ENCOUNTER — Encounter: Payer: Self-pay | Admitting: Adult Health

## 2013-07-22 VITALS — BP 110/66 | HR 78 | Temp 98.2°F | Resp 12 | Wt 158.0 lb

## 2013-07-22 DIAGNOSIS — R350 Frequency of micturition: Secondary | ICD-10-CM

## 2013-07-22 LAB — POCT URINALYSIS DIPSTICK
Glucose, UA: 100
LEUKOCYTES UA: NEGATIVE
Nitrite, UA: POSITIVE
PH UA: 5.5
PROTEIN UA: 30
RBC UA: NEGATIVE
Spec Grav, UA: >=1.03
Urobilinogen, UA: 1

## 2013-07-22 MED ORDER — CIPROFLOXACIN HCL 250 MG PO TABS: 250.0000 mg | ORAL_TABLET | Freq: Two times a day (BID) | ORAL | Status: DC

## 2013-07-22 NOTE — Progress Notes (Signed)
Patient ID: Jennifer Gardner, female   DOB: [birth year], 58 y.o.   MRN: [id number]    Subjective:    Patient ID: Jennifer Gardner, female    DOB: 05-09-1956, 58 y.o.   MRN: [id number]  HPI  Pt is a pleasant 58 y/o female who presents with urinary symptoms of pressure over her bladder, dysuria, frequency. Denies fever, chills or flank pain. No hematuria.   Past Medical History  Diagnosis Date  . Hyperlipidemia   . Hypertension   . Depression     after death of father 2004-09-19  . Shingles     back    Current Outpatient Prescriptions on File Prior to Visit  Medication Sig Dispense Refill  . acyclovir (ZOVIRAX) 400 MG tablet Take 1 tablet by mouth 2  times daily.  180 tablet  1  . DULoxetine (CYMBALTA) 30 MG capsule Take 1 capsule (30 mg total) by mouth daily.  90 capsule  3  . estradiol (VIVELLE-DOT) 0.05 MG/24HR patch Place 1 patch (0.05 mg total) onto the skin 2 (two) times a week.  24 patch  3  . progesterone (PROMETRIUM) 100 MG capsule Take 1 capsule by mouth  daily.  90 capsule  3  . rosuvastatin (CRESTOR) 20 MG tablet Take 1 tablet (20 mg total) by mouth daily.  90 tablet  3  . topiramate (TOPAMAX) 100 MG tablet Take 1 tablet (100 mg total) by mouth daily.  30 tablet  3  . triamterene-hydrochlorothiazide (MAXZIDE-25) 37.5-25 MG per tablet Take 1 tablet by mouth  daily  90 tablet  3   No current facility-administered medications on file prior to visit.     Review of Systems  Constitutional: Negative for fever and chills.  Genitourinary: Positive for dysuria, urgency and frequency. Negative for hematuria.       Objective:  BP 110/66  Pulse 78  Temp(Src) 98.2 F (36.8 C) (Oral)  Resp 12  Wt 158 lb (71.668 kg)  SpO2 98%   Physical Exam  Constitutional: She is oriented to person, place, and time. No distress.  Cardiovascular: Normal rate and regular rhythm.   Pulmonary/Chest: Effort normal. No respiratory distress.  Genitourinary:  Pressure over bladder.  Musculoskeletal: Normal  range of motion.  Neurological: She is alert and oriented to person, place, and time.  Skin: Skin is warm and dry.  Psychiatric: She has a normal mood and affect. Her behavior is normal. Judgment and thought content normal.       Assessment & Plan:   1. Urinary frequency UA shows nitrites. Send for culture. Start Cipro.  - POCT Urinalysis Dipstick - Urine Culture

## 2013-07-22 NOTE — Progress Notes (Signed)
Pre visit review using our clinic review tool, if applicable. No additional management support is needed unless otherwise documented below in the visit note. 

## 2013-07-22 NOTE — Patient Instructions (Signed)
  Start Cipro 250 mg twice a day for 3 days. Call if symptoms are not improved within 3-4 days   Urinary Tract Infection A urinary tract infection (UTI) can occur any place along the urinary tract. The tract includes the kidneys, ureters, bladder, and urethra. A type of germ called bacteria often causes a UTI. UTIs are often helped with antibiotic medicine.  HOME CARE   If given, take antibiotics as told by your doctor. Finish them even if you start to feel better.  Drink enough fluids to keep your pee (urine) clear or pale yellow.  Avoid tea, drinks with caffeine, and bubbly (carbonated) drinks.  Pee often. Avoid holding your pee in for a long time.  Pee before and after having sex (intercourse).  Wipe from front to back after you poop (bowel movement) if you are a woman. Use each tissue only once. GET HELP RIGHT AWAY IF:   You have back pain.  You have lower belly (abdominal) pain.  You have chills.  You feel sick to your stomach (nauseous).  You throw up (vomit).  Your burning or discomfort with peeing does not go away.  You have a fever.  Your symptoms are not better in 3 days. MAKE SURE YOU:   Understand these instructions.  Will watch your condition.  Will get help right away if you are not doing well or get worse. Document Released: 11/20/2007 Document Revised: 02/26/2012 Document Reviewed: 01/02/2012 [facility] Patient Information 2014 [place], [place].

## 2013-07-23 LAB — URINE CULTURE
Colony Count: NO GROWTH
Organism ID, Bacteria: NO GROWTH

## 2013-09-27 ENCOUNTER — Encounter: Payer: Self-pay | Admitting: Neurology

## 2013-09-27 ENCOUNTER — Ambulatory Visit (INDEPENDENT_AMBULATORY_CARE_PROVIDER_SITE_OTHER): Payer: 59 | Admitting: Neurology

## 2013-09-27 VITALS — BP 106/70 | HR 78 | Resp 14 | Ht 62.0 in | Wt 156.0 lb

## 2013-09-27 DIAGNOSIS — G252 Other specified forms of tremor: Principal | ICD-10-CM

## 2013-09-27 DIAGNOSIS — G250 Essential tremor: Secondary | ICD-10-CM

## 2013-09-27 MED ORDER — TOPIRAMATE 100 MG PO TABS: 100.0000 mg | ORAL_TABLET | Freq: Two times a day (BID) | ORAL | Status: DC

## 2013-09-27 MED ORDER — TOPIRAMATE 25 MG PO CPSP: ORAL_CAPSULE | ORAL | Status: DC

## 2013-09-27 NOTE — Progress Notes (Signed)
Jennifer Gardner was seen today in the movement disorders clinic for neurologic f/u.  Her PCP is Rica Mast, MD.  She is f/u for tremor.  She previously saw [name] in [place].  I do not have those records.  The patient reports that she has had tremor for 1 1/2 to 2 years.  She first noted it in her L hand but she occasionally notices it in her R.  She has some quivering in the mouth as well.   She was started on primidone and over the years has worked up to her current 250 mg tid.  She went back to [name] b/c she felt that the primidone was not working as well as it previously had.  When she went back, he thought that she had transitioned from ET to PD.  It was recommended that she try levodopa.  She did this, but did not find it helpful.  She believes that she was on it twice per day and was on it for 90 days.  It was subsequently recommended that she have a DAT scan performed in December 2013.  This turned out to be quite expensive and she was not sure that she wanted to do it, so it has not been done.  The patient states that tremor is worse in the AM.  It is worse with caffeine.  She drinks 1/2 caffeine coffee and she drinks 2 mugs of that in the AM.  She does drink unsweet tea but notices no difference with that.  She does note that a BC powder will make it worse.  Alcohol may improve tremor.  She has a maternal uncle with PD and her uncles son has tremor but it is not PD.  She notices the tremor worse when she is moving the arms.  She notices the mouth tremor with eating and her friends have pointed it out to her.  She is able to eat soup or peas without trouble.  Stress may increase the tremor.  Last visit, we weaned her off the cymbalta.  She initially had increasing depression with this and we slowed down the wean and increased the prozac.  She still doesn't feel as good as she did on the cymbalta but does not want to go back to it yet.  She has the same amount of tremor.   03/05/13  update:  Last visit, propranolol was added.  However, this caused her blood pressure to drop.  The patient did call because we had changed her Cymbalta to Prozac as she thought that this causes tremor.  However, once she got off of the Cymbalta, the tremor was no better, depression was worse and she ultimately went back to the Cymbalta.  Today, she states that she would like to go off of the primidone.  She thinks that it contributes or causes her Dupuytren's contractures.  She was on a very large dosage of primidone at 250 mg 3 times per day and she self weaned it and is now on 250 mg once per day.  She noted no change in the tremor.  The tremor is most bothersome in the AM.  Today she notes that while she knew her maternal uncle had PD, his sons (her cousin) dx was recently changed to PD.  06/25/13 update:  The pt has tremor.  Since last visit she has had a DaT scan that was normal.  She had weaned herself off of primidone and tremor got worse but not significantly so.  She does know that she is not as groggy as previously.  She is on cymbalta and doing well in that regard.  09/27/13 update:  Last patient, the pt was started on topamax for ET.  She is on 100 mg daily.  She doesn't think that it was helpful. She is on it one time per day.  She had some taste aversion to soda and some paresthesias when she initially started the medication, but both of those are now better.  She did have foot surgery since our last visit.  She has recovered well from that.  Neuroimaging has not previously been performed.    PREVIOUS MEDICATIONS: Sinemet and primidone  ALLERGIES:   Allergies  Allergen Reactions  . Sulfa Antibiotics Hives    CURRENT MEDICATIONS:  Current Outpatient Prescriptions on File Prior to Visit  Medication Sig Dispense Refill  . acyclovir (ZOVIRAX) 400 MG tablet Take 1 tablet by mouth 2  times daily.  180 tablet  1  . DULoxetine (CYMBALTA) 30 MG capsule Take 1 capsule (30 mg total) by mouth  daily.  90 capsule  3  . estradiol (VIVELLE-DOT) 0.05 MG/24HR patch Place 1 patch (0.05 mg total) onto the skin 2 (two) times a week.  24 patch  3  . progesterone (PROMETRIUM) 100 MG capsule Take 1 capsule by mouth  daily.  90 capsule  3  . rosuvastatin (CRESTOR) 20 MG tablet Take 1 tablet (20 mg total) by mouth daily.  90 tablet  3  . topiramate (TOPAMAX) 100 MG tablet Take 1 tablet (100 mg total) by mouth daily.  30 tablet  3  . triamterene-hydrochlorothiazide (MAXZIDE-25) 37.5-25 MG per tablet Take 1 tablet by mouth  daily  90 tablet  3   No current facility-administered medications on file prior to visit.    PAST MEDICAL HISTORY:   Past Medical History  Diagnosis Date  . Hyperlipidemia   . Hypertension   . Depression     after death of father 10/01/2004  . Shingles     back    PAST SURGICAL HISTORY:   Past Surgical History  Procedure Laterality Date  . Breast surgery      breast reduction  . Foot surgery Bilateral 06/2013    Dr. Elvina Mattes    SOCIAL HISTORY:   History   Social History  . Marital Status: Married    Spouse Name: N/A    Number of Children: N/A  . Years of Education: N/A   Occupational History  . Retired     [employer]   Social History Main Topics  . Smoking status: Never Smoker   . Smokeless tobacco: Never Used  . Alcohol Use: Yes     Comment: occassionally  . Drug Use: No  . Sexual Activity: Not on file   Other Topics Concern  . Not on file   Social History Narrative   Lives in [place] with husband. Dog in home.      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      Diet - regular      Exercise - 5 days per week 56min    FAMILY HISTORY:   Family Status  Relation Status Death Age  . Mother Alive     healthy  . Father Deceased     CHF  . Sister Alive     alive  . Maternal Uncle Deceased     PD    ROS:  A complete 10 system review  of systems was obtained and was unremarkable apart from what is  mentioned above.  PHYSICAL EXAMINATION:    VITALS:   Filed Vitals:   09/27/13 1056  BP: 106/70  Pulse: 78  Resp: 14  Height: 5\' 2"  (1.575 m)  Weight: 156 lb (70.761 kg)   Wt Readings from Last 3 Encounters:  09/27/13 156 lb (70.761 kg)  07/22/13 158 lb (71.668 kg)  06/25/13 159 lb 12.8 oz (72.485 kg)    GEN:  The patient appears stated age and is in NAD. HEENT:  Normocephalic, atraumatic.  The mucous membranes are moist. The superficial temporal arteries are without ropiness or tenderness. CV:  RRR Lungs:  CTAB Neck/HEME:  There are no carotid bruits bilaterally.  Neurological examination:  Orientation: The patient is alert and oriented x3. Fund of knowledge is appropriate.  Recent and remote memory are intact.  Attention and concentration are normal.    Able to name objects and repeat phrases. Cranial nerves: There is good facial symmetry. Pupils are equal round and reactive to light bilaterally. Fundoscopic exam reveals clear margins bilaterally.  There are no square wave jerks.  Extraocular muscles are intact. The visual fields are full to confrontational testing. The speech is fluent and clear. Soft palate rises symmetrically and there is no tongue deviation. Hearing is intact to conversational tone. Sensation: Sensation is intact to light and pinprick throughout (facial, trunk, extremities). Vibration is intact at the bilateral big toe. There is no extinction with double simultaneous stimulation. There is no sensory dermatomal level identified. Motor: Strength is 5/5 in the bilateral upper and lower extremities.   Shoulder shrug is equal and symmetric.  There is no pronator drift.  There are no fasciculations. Deep tendon reflexes: Deep tendon reflexes are 2/4 at the bilateral biceps, triceps, brachioradialis, patella and achilles. Plantar responses are downgoing bilaterally.  Movement examination: Tone: There is normal tone in the bilateral upper extremities.  The tone in the  lower extremities is normal.  Abnormal movements: There is no tremor today.  There is no postural or kinetic tremor.  There is a chin tremor that is prominent when she is concentrating on a task.  She is able to draw concentric circles without significant difficulty.  She can pour water (full glass) from one cup to another without trouble. Coordination:  There is mild decremation with RAM's, seen most prominently with hand opening/closing, alternating supination/pronation of the forearm and finger taps on the L.  Heel taps and toe taps were good. Gait and Station: The patient has no difficulty arising out of a deep-seated chair without the use of the hands. The patient's stride length is normal.      ASSESSMENT/PLAN:  1.  Tremor.  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   DaT scan was done and was negative.  I reassured her again today that she does not have Parkinson's disease.  -Archimedes spirals have definitely improved over the course of time.  We decided to try and increase the Topamax slowly to 100 mg twice a day.  Risks, benefits, side effects and alternative therapies were discussed.  The opportunity to ask questions was given and they were answered to the best of my ability.  The patient expressed understanding and willingness to follow the outlined treatment protocols.  -If this does not help, we certainly have other options including Artane and propranolol. 2.   Depression.  -She is doing well  back on the cymbalta 3.  I will plan on seeing the patient back in the next 3 months, but encouraged her to call me should any questions or concerns arise before that time.

## 2013-09-27 NOTE — Patient Instructions (Signed)
1.  topamax 25 mg:  1 tablet daily for 1 week, then 2 tablets daily for 1 week and then 3 tablets daily for 1 week in the AM until you run out of the medication 2.  Continue the topamax 100 mg at night 3.  When the topamax 25 mg runs out, you will take topamax 100 mg twice per day

## 2013-11-22 ENCOUNTER — Other Ambulatory Visit: Payer: Self-pay | Admitting: *Deleted

## 2013-11-22 ENCOUNTER — Telehealth: Payer: Self-pay | Admitting: Internal Medicine

## 2013-11-22 MED ORDER — PROGESTERONE MICRONIZED 100 MG PO CAPS: ORAL_CAPSULE | ORAL | Status: DC

## 2013-11-22 MED ORDER — ESTRADIOL 0.05 MG/24HR TD PTTW: 1.0000 | MEDICATED_PATCH | TRANSDERMAL | Status: DC

## 2013-11-22 NOTE — Telephone Encounter (Signed)
The patient is needing new prescriptions to send to her mail order. Please call patient to pick up when they are ready.  estradiol (VIVELLE-DOT) 0.05 MG/24HR patch  progesterone (PROMETRIUM) 100 MG capsule

## 2013-11-22 NOTE — Telephone Encounter (Signed)
Pt states that she is no longer using mail order pharmacy and needs Rx sent to [address]

## 2013-11-22 NOTE — Telephone Encounter (Signed)
Pt changed insurance and needs Rx sent to [facility] on [address]

## 2013-12-21 ENCOUNTER — Encounter: Payer: Self-pay | Admitting: Internal Medicine

## 2013-12-21 ENCOUNTER — Ambulatory Visit (INDEPENDENT_AMBULATORY_CARE_PROVIDER_SITE_OTHER): Payer: BC Managed Care – PPO | Admitting: Internal Medicine

## 2013-12-21 VITALS — BP 104/72 | HR 82 | Temp 97.8°F | Wt 158.0 lb

## 2013-12-21 DIAGNOSIS — I10 Essential (primary) hypertension: Secondary | ICD-10-CM

## 2013-12-21 DIAGNOSIS — E785 Hyperlipidemia, unspecified: Secondary | ICD-10-CM

## 2013-12-21 LAB — LIPID PANEL
CHOLESTEROL: 191 mg/dL (ref 0–200)
HDL: 51.6 mg/dL (ref >39.00)
LDL CALC: 107 mg/dL — AB (ref 0–99)
NonHDL: 139.4
Total CHOL/HDL Ratio: 4
Triglycerides: 163 mg/dL — ABNORMAL HIGH (ref 0.0–149.0)
VLDL: 32.6 mg/dL (ref 0.0–40.0)

## 2013-12-21 LAB — COMPREHENSIVE METABOLIC PANEL
ALBUMIN: 4.4 g/dL (ref 3.5–5.2)
ALK PHOS: 41 U/L (ref 39–117)
ALT: 24 U/L (ref 0–35)
AST: 23 U/L (ref 0–37)
BILIRUBIN TOTAL: 0.8 mg/dL (ref 0.2–1.2)
BUN: 10 mg/dL (ref 6–23)
CO2: 24 mEq/L (ref 19–32)
Calcium: 9.6 mg/dL (ref 8.4–10.5)
Chloride: 105 mEq/L (ref 96–112)
Creatinine, Ser: 0.7 mg/dL (ref 0.4–1.2)
GFR: 94.63 mL/min (ref >60.00)
Glucose, Bld: 99 mg/dL (ref 70–99)
Potassium: 4.5 mEq/L (ref 3.5–5.1)
SODIUM: 138 meq/L (ref 135–145)
Total Protein: 6.9 g/dL (ref 6.0–8.3)

## 2013-12-21 LAB — MICROALBUMIN / CREATININE URINE RATIO
Creatinine,U: 132.6 mg/dL
Microalb Creat Ratio: 0.2 mg/g (ref 0.0–30.0)
Microalb, Ur: 0.3 mg/dL (ref 0.0–1.9)

## 2013-12-21 MED ORDER — ROSUVASTATIN CALCIUM 20 MG PO TABS: 20.0000 mg | ORAL_TABLET | Freq: Every day | ORAL | Status: DC

## 2013-12-21 MED ORDER — DULOXETINE HCL 30 MG PO CPEP: 30.0000 mg | ORAL_CAPSULE | Freq: Every day | ORAL | Status: DC

## 2013-12-21 MED ORDER — ACYCLOVIR 400 MG PO TABS: ORAL_TABLET | ORAL | Status: DC

## 2013-12-21 MED ORDER — TRIAMTERENE-HCTZ 37.5-25 MG PO TABS: ORAL_TABLET | ORAL | Status: DC

## 2013-12-21 NOTE — Patient Instructions (Signed)
Labs today.  Continue current medications.  Follow up in 6 months.

## 2013-12-21 NOTE — Assessment & Plan Note (Signed)
Will check lipids and LFTs with labs. Continue Crestor.

## 2013-12-21 NOTE — Progress Notes (Signed)
   Subjective:    Patient ID: Jennifer Gardner, female    DOB: 1956-06-03, 58 y.o.   MRN: [id number]  HPI 58YO female presents for follow up.  Doing well. No concerns today. Compliant with meds. Continues to follow with Dr. Carles Collet. Recent scan suggested no Parkinson's disease.  Review of Systems  Constitutional: Negative for fever, chills, appetite change, fatigue and unexpected weight change.  Eyes: Negative for visual disturbance.  Respiratory: Negative for shortness of breath.   Cardiovascular: Negative for chest pain and leg swelling.  Gastrointestinal: Negative for abdominal pain.  Skin: Negative for color change and rash.  Neurological: Positive for tremors. Negative for weakness.  Hematological: Negative for adenopathy. Does not bruise/bleed easily.  Psychiatric/Behavioral: Negative for dysphoric mood. The patient is not nervous/anxious.        Objective:    BP 104/72  Pulse 82  Temp(Src) 97.8 F (36.6 C) (Oral)  Wt 158 lb (71.668 kg)  SpO2 95% Physical Exam  Constitutional: She is oriented to person, place, and time. She appears well-developed and well-nourished. No distress.  HENT:  Head: Normocephalic and atraumatic.  Right Ear: External ear normal.  Left Ear: External ear normal.  Nose: Nose normal.  Mouth/Throat: Oropharynx is clear and moist. No oropharyngeal exudate.  Eyes: Conjunctivae are normal. Pupils are equal, round, and reactive to light. Right eye exhibits no discharge. Left eye exhibits no discharge. No scleral icterus.  Neck: Normal range of motion. Neck supple. No tracheal deviation present. No thyromegaly present.  Cardiovascular: Normal rate, regular rhythm, normal heart sounds and intact distal pulses.  Exam reveals no gallop and no friction rub.   No murmur heard. Pulmonary/Chest: Effort normal and breath sounds normal. No accessory muscle usage. Not tachypneic. No respiratory distress. She has no decreased breath sounds. She has no wheezes. She has no  rhonchi. She has no rales. She exhibits no tenderness.  Musculoskeletal: Normal range of motion. She exhibits no edema and no tenderness.  Lymphadenopathy:    She has no cervical adenopathy.  Neurological: She is alert and oriented to person, place, and time. She displays tremor (hands bilateral and head). No cranial nerve deficit. She exhibits normal muscle tone. Coordination normal.  Skin: Skin is warm and dry. No rash noted. She is not diaphoretic. No erythema. No pallor.  Psychiatric: She has a normal mood and affect. Her behavior is normal. Judgment and thought content normal.          Assessment & Plan:   Problem List Items Addressed This Visit     Unprioritized   Hyperlipidemia     Will check lipids and LFTs with labs. Continue Crestor.    Relevant Medications      triamterene-hydrochlorothiazide (MAXZIDE-25) 37.5-25 MG per tablet      rosuvastatin (CRESTOR) tablet   Other Relevant Orders      Lipid panel   Hypertension - Primary      BP Readings from Last 3 Encounters:  12/21/13 104/72  09/27/13 106/70  07/22/13 110/66   BP well controlled on current medication. Will check renal function with labs today.    Relevant Medications      triamterene-hydrochlorothiazide (MAXZIDE-25) 37.5-25 MG per tablet      rosuvastatin (CRESTOR) tablet   Other Relevant Orders      Comprehensive metabolic panel      Microalbumin / creatinine urine ratio       Return in about 6 months (around 06/23/2014) for Physical.

## 2013-12-21 NOTE — Progress Notes (Signed)
Pre visit review using our clinic review tool, if applicable. No additional management support is needed unless otherwise documented below in the visit note. 

## 2013-12-21 NOTE — Assessment & Plan Note (Signed)
BP Readings from Last 3 Encounters:  12/21/13 104/72  09/27/13 106/70  07/22/13 110/66   BP well controlled on current medication. Will check renal function with labs today.

## 2013-12-22 ENCOUNTER — Telehealth: Payer: Self-pay | Admitting: Internal Medicine

## 2013-12-22 ENCOUNTER — Encounter: Payer: Self-pay | Admitting: *Deleted

## 2013-12-22 NOTE — Telephone Encounter (Signed)
Relevant patient education assigned to patient using Emmi. ° °

## 2013-12-28 ENCOUNTER — Telehealth: Payer: Self-pay | Admitting: Neurology

## 2013-12-28 ENCOUNTER — Ambulatory Visit: Payer: 59 | Admitting: Neurology

## 2013-12-28 NOTE — Telephone Encounter (Signed)
Pt came in for her appt at 11:06AM. She showed uo one hour late. She thought her appt was at 11AM. She r/s to come in tomorrow 12/29/13 10AM.

## 2013-12-29 ENCOUNTER — Encounter: Payer: Self-pay | Admitting: Neurology

## 2013-12-29 ENCOUNTER — Ambulatory Visit (INDEPENDENT_AMBULATORY_CARE_PROVIDER_SITE_OTHER): Payer: BC Managed Care – PPO | Admitting: Neurology

## 2013-12-29 VITALS — BP 110/70 | HR 80 | Resp 18 | Ht 62.0 in | Wt 156.0 lb

## 2013-12-29 DIAGNOSIS — F32A Depression, unspecified: Secondary | ICD-10-CM

## 2013-12-29 DIAGNOSIS — G252 Other specified forms of tremor: Principal | ICD-10-CM

## 2013-12-29 DIAGNOSIS — F329 Major depressive disorder, single episode, unspecified: Secondary | ICD-10-CM

## 2013-12-29 DIAGNOSIS — G250 Essential tremor: Secondary | ICD-10-CM

## 2013-12-29 DIAGNOSIS — F3289 Other specified depressive episodes: Secondary | ICD-10-CM

## 2013-12-29 NOTE — Progress Notes (Signed)
Jennifer Gardner was seen today in the movement disorders clinic for neurologic f/u.  Her PCP is Rica Mast, MD.  She is f/u for tremor.  She previously saw [name] in [place].  I do not have those records.  The patient reports that she has had tremor for 1 1/2 to 2 years.  She first noted it in her L hand but she occasionally notices it in her R.  She has some quivering in the mouth as well.   She was started on primidone and over the years has worked up to her current 250 mg tid.  She went back to [name] b/c she felt that the primidone was not working as well as it previously had.  When she went back, he thought that she had transitioned from ET to PD.  It was recommended that she try levodopa.  She did this, but did not find it helpful.  She believes that she was on it twice per day and was on it for 90 days.  It was subsequently recommended that she have a DAT scan performed in December 2013.  This turned out to be quite expensive and she was not sure that she wanted to do it, so it has not been done.  The patient states that tremor is worse in the AM.  It is worse with caffeine.  She drinks 1/2 caffeine coffee and she drinks 2 mugs of that in the AM.  She does drink unsweet tea but notices no difference with that.  She does note that a BC powder will make it worse.  Alcohol may improve tremor.  She has a maternal uncle with PD and her uncles son has tremor but it is not PD.  She notices the tremor worse when she is moving the arms.  She notices the mouth tremor with eating and her friends have pointed it out to her.  She is able to eat soup or peas without trouble.  Stress may increase the tremor.  Last visit, we weaned her off the cymbalta.  She initially had increasing depression with this and we slowed down the wean and increased the prozac.  She still doesn't feel as good as she did on the cymbalta but does not want to go back to it yet.  She has the same amount of tremor.   03/05/13  update:  Last visit, propranolol was added.  However, this caused her blood pressure to drop.  The patient did call because we had changed her Cymbalta to Prozac as she thought that this causes tremor.  However, once she got off of the Cymbalta, the tremor was no better, depression was worse and she ultimately went back to the Cymbalta.  Today, she states that she would like to go off of the primidone.  She thinks that it contributes or causes her Dupuytren's contractures.  She was on a very large dosage of primidone at 250 mg 3 times per day and she self weaned it and is now on 250 mg once per day.  She noted no change in the tremor.  The tremor is most bothersome in the AM.  Today she notes that while she knew her maternal uncle had PD, his sons (her cousin) dx was recently changed to PD.  06/25/13 update:  The pt has tremor.  Since last visit she has had a DaT scan that was normal.  She had weaned herself off of primidone and tremor got worse but not significantly so.  She does know that she is not as groggy as previously.  She is on cymbalta and doing well in that regard.  09/27/13 update:  Last patient, the pt was started on topamax for ET.  She is on 100 mg daily.  She doesn't think that it was helpful. She is on it one time per day.  She had some taste aversion to soda and some paresthesias when she initially started the medication, but both of those are now better.  She did have foot surgery since our last visit.  She has recovered well from that.  12/29/13 update:  Last visit, I increased the patient's Topamax for her essential tremor treatment with 100 mg twice per day.  The patient reports that she is doing better.  She denies significant side effects with the medication.  Friends tell her that they note a significant difference in mouth tremor and that is something that she doesn't even notice.    Neuroimaging has not previously been performed.    PREVIOUS MEDICATIONS: Sinemet and  primidone  ALLERGIES:   Allergies  Allergen Reactions  . Sulfa Antibiotics Hives    CURRENT MEDICATIONS:  Current Outpatient Prescriptions on File Prior to Visit  Medication Sig Dispense Refill  . acyclovir (ZOVIRAX) 400 MG tablet Take 1 tablet by mouth 2  times daily.  180 tablet  1  . DULoxetine (CYMBALTA) 30 MG capsule Take 1 capsule (30 mg total) by mouth daily.  90 capsule  3  . estradiol (VIVELLE-DOT) 0.05 MG/24HR patch Place 1 patch (0.05 mg total) onto the skin 2 (two) times a week.  16 patch  6  . progesterone (PROMETRIUM) 100 MG capsule Take 1 capsule by mouth  daily.  30 capsule  6  . rosuvastatin (CRESTOR) 20 MG tablet Take 1 tablet (20 mg total) by mouth daily.  90 tablet  3  . topiramate (TOPAMAX) 100 MG tablet Take 1 tablet (100 mg total) by mouth 2 (two) times daily.  60 tablet  3  . triamterene-hydrochlorothiazide (MAXZIDE-25) 37.5-25 MG per tablet Take 1 tablet by mouth  daily  90 tablet  3   No current facility-administered medications on file prior to visit.    PAST MEDICAL HISTORY:   Past Medical History  Diagnosis Date  . Hyperlipidemia   . Hypertension   . Depression     after death of father 2004/09/18  . Shingles     back    PAST SURGICAL HISTORY:   Past Surgical History  Procedure Laterality Date  . Breast surgery      breast reduction  . Foot surgery Bilateral 06/2013    Dr. Elvina Mattes    SOCIAL HISTORY:   History   Social History  . Marital Status: Married    Spouse Name: N/A    Number of Children: N/A  . Years of Education: N/A   Occupational History  . Retired     [employer]   Social History Main Topics  . Smoking status: Never Smoker   . Smokeless tobacco: Never Used  . Alcohol Use: Yes     Comment: occassionally  . Drug Use: No  . Sexual Activity: Not on file   Other Topics Concern  . Not on file   Social History Narrative   Lives in [place] with husband. Dog in home.      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      Diet - regular  Exercise - 5 days per week 67min    FAMILY HISTORY:   Family Status  Relation Status Death Age  . Mother Alive     healthy  . Father Deceased     CHF  . Sister Alive     alive  . Maternal Uncle Deceased     PD    ROS:  A complete 10 system review of systems was obtained and was unremarkable apart from what is mentioned above.  PHYSICAL EXAMINATION:    VITALS:   Filed Vitals:   12/29/13 1000  BP: 110/70  Pulse: 80  Resp: 18  Height: 5\' 2"  (1.575 m)  Weight: 156 lb (70.761 kg)   Wt Readings from Last 3 Encounters:  12/29/13 156 lb (70.761 kg)  12/21/13 158 lb (71.668 kg)  09/27/13 156 lb (70.761 kg)    GEN:  The patient appears stated age and is in NAD. HEENT:  Normocephalic, atraumatic.  The mucous membranes are moist. The superficial temporal arteries are without ropiness or tenderness. CV:  RRR Lungs:  CTAB Neck/HEME:  There are no carotid bruits bilaterally.  Neurological examination:  Orientation: The patient is alert and oriented x3. Fund of knowledge is appropriate.  Recent and remote memory are intact.  Attention and concentration are normal.    Able to name objects and repeat phrases. Cranial nerves: There is good facial symmetry.The speech is fluent and clear. Soft palate rises symmetrically and there is no tongue deviation. Hearing is intact to conversational tone. Sensation: Sensation is intact to light touch throughout. Motor: Strength is 5/5 in the bilateral upper and lower extremities.     Movement examination: Tone: There is normal tone in the bilateral upper extremities.  The tone in the lower extremities is normal.  Abnormal movements: There is no tremor today.  There is very minor postural tremor on the left hand, but almost none on the right hand.  Archimedes spirals are drawn remarkably well.  Almost no chin tremor today. Coordination:  There is mild decremation with RAM's, seen most  prominently with hand opening/closing, alternating supination/pronation of the forearm and finger taps on the L.  Heel taps and toe taps were good. Gait and Station: The patient has no difficulty arising out of a deep-seated chair without the use of the hands. The patient's stride length is normal.      ASSESSMENT/PLAN:  1.  essential tremor.  -  DaT scan was done and was negative.  I reassured her again today that she does not have Parkinson's disease.  -She will remain on Topamax 100 mg twice a day.  She asked about increasing it again, but tremor is under good control and there are increase cognitive side effect risks above 200 mg, so really want to hold off on that for now.  She really had no complaints of tremor, so I don't think it is worth going up right now.  Risks, benefits, side effects and alternative therapies were discussed.  The opportunity to ask questions was given and they were answered to the best of my ability.  The patient expressed understanding and willingness to follow the outlined treatment protocols. 2.   Depression.  -She is doing well back on the cymbalta 3.  I will plan on seeing the patient back in the next 6 months, but encouraged her to call me should any questions or concerns arise before that time.

## 2013-12-31 ENCOUNTER — Telehealth: Payer: Self-pay | Admitting: Neurology

## 2013-12-31 MED ORDER — TOPIRAMATE 100 MG PO TABS: 100.0000 mg | ORAL_TABLET | Freq: Two times a day (BID) | ORAL | Status: DC

## 2013-12-31 NOTE — Telephone Encounter (Signed)
Pt forgot to ask for refill on Topamax 100mg  2x per day. Uses [facility] in [place], [place] [phone] / Gayleen [address].

## 2013-12-31 NOTE — Telephone Encounter (Signed)
Prescription sent to patient's pharmacy.

## 2014-06-17 HISTORY — PX: FACIAL COSMETIC SURGERY: SHX629

## 2014-06-28 ENCOUNTER — Encounter: Payer: BC Managed Care – PPO | Admitting: Internal Medicine

## 2014-07-01 ENCOUNTER — Ambulatory Visit: Payer: BC Managed Care – PPO | Admitting: Neurology

## 2014-07-05 ENCOUNTER — Telehealth: Payer: Self-pay | Admitting: Neurology

## 2014-07-05 NOTE — Telephone Encounter (Signed)
Pt resch appt from 07-11-14 to 07-14-14

## 2014-07-11 ENCOUNTER — Ambulatory Visit: Payer: BC Managed Care – PPO | Admitting: Neurology

## 2014-07-11 ENCOUNTER — Ambulatory Visit: Payer: Self-pay | Admitting: Internal Medicine

## 2014-07-11 LAB — HM MAMMOGRAPHY: HM MAMMO: NEGATIVE

## 2014-07-12 ENCOUNTER — Encounter: Payer: BC Managed Care – PPO | Admitting: Internal Medicine

## 2014-07-13 ENCOUNTER — Other Ambulatory Visit: Payer: Self-pay | Admitting: Internal Medicine

## 2014-07-13 NOTE — Telephone Encounter (Signed)
Last refill 11.3.15, last OV 7.7.15.  Please advise refill

## 2014-07-14 ENCOUNTER — Encounter: Payer: Self-pay | Admitting: Neurology

## 2014-07-14 ENCOUNTER — Ambulatory Visit (INDEPENDENT_AMBULATORY_CARE_PROVIDER_SITE_OTHER): Payer: BLUE CROSS/BLUE SHIELD | Admitting: Neurology

## 2014-07-14 VITALS — BP 108/64 | HR 80 | Ht 63.0 in | Wt 158.0 lb

## 2014-07-14 DIAGNOSIS — R599 Enlarged lymph nodes, unspecified: Secondary | ICD-10-CM

## 2014-07-14 DIAGNOSIS — R590 Localized enlarged lymph nodes: Secondary | ICD-10-CM

## 2014-07-14 DIAGNOSIS — G252 Other specified forms of tremor: Principal | ICD-10-CM

## 2014-07-14 DIAGNOSIS — R251 Tremor, unspecified: Secondary | ICD-10-CM

## 2014-07-14 DIAGNOSIS — G250 Essential tremor: Secondary | ICD-10-CM

## 2014-07-14 NOTE — Progress Notes (Signed)
Jennifer Gardner was seen today in the movement disorders clinic for neurologic f/u.  Her PCP is Rica Mast, MD.  She is f/u for tremor.  She previously saw [name] in [place].  I do not have those records.  The patient reports that she has had tremor for 1 1/2 to 2 years.  She first noted it in her L hand but she occasionally notices it in her R.  She has some quivering in the mouth as well.   She was started on primidone and over the years has worked up to her current 250 mg tid.  She went back to [name] b/c she felt that the primidone was not working as well as it previously had.  When she went back, he thought that she had transitioned from ET to PD.  It was recommended that she try levodopa.  She did this, but did not find it helpful.  She believes that she was on it twice per day and was on it for 90 days.  It was subsequently recommended that she have a DAT scan performed in December 2013.  This turned out to be quite expensive and she was not sure that she wanted to do it, so it has not been done.  The patient states that tremor is worse in the AM.  It is worse with caffeine.  She drinks 1/2 caffeine coffee and she drinks 2 mugs of that in the AM.  She does drink unsweet tea but notices no difference with that.  She does note that a BC powder will make it worse.  Alcohol may improve tremor.  She has a maternal uncle with PD and her uncles son has tremor but it is not PD.  She notices the tremor worse when she is moving the arms.  She notices the mouth tremor with eating and her friends have pointed it out to her.  She is able to eat soup or peas without trouble.  Stress may increase the tremor.  Last visit, we weaned her off the cymbalta.  She initially had increasing depression with this and we slowed down the wean and increased the prozac.  She still doesn't feel as good as she did on the cymbalta but does not want to go back to it yet.  She has the same amount of tremor.   03/05/13  update:  Last visit, propranolol was added.  However, this caused her blood pressure to drop.  The patient did call because we had changed her Cymbalta to Prozac as she thought that this causes tremor.  However, once she got off of the Cymbalta, the tremor was no better, depression was worse and she ultimately went back to the Cymbalta.  Today, she states that she would like to go off of the primidone.  She thinks that it contributes or causes her Dupuytren's contractures.  She was on a very large dosage of primidone at 250 mg 3 times per day and she self weaned it and is now on 250 mg once per day.  She noted no change in the tremor.  The tremor is most bothersome in the AM.  Today she notes that while she knew her maternal uncle had PD, his sons (her cousin) dx was recently changed to PD.  06/25/13 update:  The pt has tremor.  Since last visit she has had a DaT scan that was normal.  She had weaned herself off of primidone and tremor got worse but not significantly so.  She does know that she is not as groggy as previously.  She is on cymbalta and doing well in that regard.  09/27/13 update:  Last patient, the pt was started on topamax for ET.  She is on 100 mg daily.  She doesn't think that it was helpful. She is on it one time per day.  She had some taste aversion to soda and some paresthesias when she initially started the medication, but both of those are now better.  She did have foot surgery since our last visit.  She has recovered well from that.  12/29/13 update:  Last visit, I increased the patient's Topamax for her essential tremor treatment with 100 mg twice per day.  The patient reports that she is doing better.  She denies significant side effects with the medication.  Friends tell her that they note a significant difference in mouth tremor and that is something that she doesn't even notice.    07/14/14 update:  The patient returns today for follow-up.  She reports that she is doing very well.   Reports that tremor is virtually gone.  She denies any side effects with the Topamax.  She did have a face lift about 2 weeks ago.  Neuroimaging has not previously been performed.    PREVIOUS MEDICATIONS: Sinemet and primidone  ALLERGIES:   Allergies  Allergen Reactions  . Sulfa Antibiotics Hives  . Penicillins Hives and Rash    CURRENT MEDICATIONS:  Current Outpatient Prescriptions on File Prior to Visit  Medication Sig Dispense Refill  . acyclovir (ZOVIRAX) 400 MG tablet take 1 tablet by mouth twice a day 180 tablet 1  . DULoxetine (CYMBALTA) 30 MG capsule Take 1 capsule (30 mg total) by mouth daily. 90 capsule 3  . estradiol (VIVELLE-DOT) 0.05 MG/24HR patch Place 1 patch (0.05 mg total) onto the skin 2 (two) times a week. 16 patch 6  . progesterone (PROMETRIUM) 100 MG capsule Take 1 capsule by mouth  daily. 30 capsule 6  . rosuvastatin (CRESTOR) 20 MG tablet Take 1 tablet (20 mg total) by mouth daily. 90 tablet 3  . topiramate (TOPAMAX) 100 MG tablet Take 1 tablet (100 mg total) by mouth 2 (two) times daily. 60 tablet 5  . triamterene-hydrochlorothiazide (MAXZIDE-25) 37.5-25 MG per tablet Take 1 tablet by mouth  daily 90 tablet 3   No current facility-administered medications on file prior to visit.    PAST MEDICAL HISTORY:   Past Medical History  Diagnosis Date  . Hyperlipidemia   . Hypertension   . Depression     after death of father 08-Oct-2004  . Shingles     back    PAST SURGICAL HISTORY:   Past Surgical History  Procedure Laterality Date  . Breast surgery      breast reduction  . Foot surgery Bilateral 06/2013    Dr. Elvina Mattes    SOCIAL HISTORY:   History   Social History  . Marital Status: Married    Spouse Name: N/A    Number of Children: N/A  . Years of Education: N/A   Occupational History  . Retired     [employer]   Social History Main Topics  . Smoking status: Never Smoker   . Smokeless tobacco: Never Used  . Alcohol Use: Yes     Comment:  occassionally  . Drug Use: No  . Sexual Activity: Not on file   Other Topics Concern  . Not on file   Social History Narrative   Lives  in [place] with husband. Dog in home.      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      Diet - regular      Exercise - 5 days per week 25min    FAMILY HISTORY:   Family Status  Relation Status Death Age  . Mother Alive     healthy  . Father Deceased     CHF  . Sister Alive     alive  . Maternal Uncle Deceased     PD    ROS:  A complete 10 system review of systems was obtained and was unremarkable apart from what is mentioned above.  PHYSICAL EXAMINATION:    VITALS:   Filed Vitals:   07/14/14 1301  BP: 108/64  Pulse: 80  Height: 5\' 3"  (1.6 m)  Weight: 158 lb (71.668 kg)   Wt Readings from Last 3 Encounters:  07/14/14 158 lb (71.668 kg)  12/29/13 156 lb (70.761 kg)  12/21/13 158 lb (71.668 kg)    GEN:  The patient appears stated age and is in NAD. HEENT:  Normocephalic, atraumatic.  The mucous membranes are moist. The superficial temporal arteries are without ropiness or tenderness. CV:  RRR Lungs:  CTAB Neck/HEME:  There are no carotid bruits bilaterally.  There is a prominent left cervical anterior lymph node  Neurological examination:  Orientation: The patient is alert and oriented x3. Fund of knowledge is appropriate.  Recent and remote memory are intact.  Attention and concentration are normal.    Able to name objects and repeat phrases. Cranial nerves: There is good facial symmetry.The speech is fluent and clear. Soft palate rises symmetrically and there is no tongue deviation. Hearing is intact to conversational tone. Sensation: Sensation is intact to light touch throughout. Motor: Strength is 5/5 in the bilateral upper and lower extremities.     Movement examination: Tone: There is normal tone in the bilateral upper extremities.  The tone in the lower extremities is  normal.  Abnormal movements: There is no tremor today in the hands.  There is rare chin tremor.  She pours a very full glass of water from one glass to another without any difficulty. Coordination:  There is no trouble with rapid alternating movements today. Gait and Station: The patient has no difficulty arising out of a deep-seated chair without the use of the hands. The patient's stride length is normal.      ASSESSMENT/PLAN:  1.  essential tremor.  -  DaT scan was done and was negative.  I reassured her again today that she does not have Parkinson's disease.  -She will remain on Topamax 100 mg twice a day.   Risks, benefits, side effects and alternative therapies were discussed.  The opportunity to ask questions was given and they were answered to the best of my ability.  The patient expressed understanding and willingness to follow the outlined treatment protocols. 2.   Depression.  -She is doing well back on the cymbalta 3.  Left anterior cervical lymph node  -I talked to her about this today.  She stated that she just had a face lift and thinks that it showed up after that.  I asked her to follow-up with her primary care physician if it does not resolve. 4.  I will plan on seeing the patient back in the next year, but encouraged her to call me should any questions or concerns arise before that time.

## 2014-07-15 ENCOUNTER — Encounter: Payer: Self-pay | Admitting: Internal Medicine

## 2014-07-18 ENCOUNTER — Other Ambulatory Visit: Payer: Self-pay | Admitting: *Deleted

## 2014-07-18 ENCOUNTER — Other Ambulatory Visit: Payer: Self-pay | Admitting: Internal Medicine

## 2014-08-12 ENCOUNTER — Encounter: Payer: Self-pay | Admitting: Internal Medicine

## 2014-08-12 ENCOUNTER — Other Ambulatory Visit (HOSPITAL_COMMUNITY)
Admission: RE | Admit: 2014-08-12 | Discharge: 2014-08-12 | Disposition: A | Discharge status: Home / Self Care | Payer: BLUE CROSS/BLUE SHIELD | Source: Ambulatory Visit | Attending: Internal Medicine | Admitting: Internal Medicine

## 2014-08-12 ENCOUNTER — Ambulatory Visit (INDEPENDENT_AMBULATORY_CARE_PROVIDER_SITE_OTHER): Payer: BLUE CROSS/BLUE SHIELD | Admitting: Internal Medicine

## 2014-08-12 VITALS — BP 101/63 | HR 66 | Temp 98.0°F | Ht 62.25 in | Wt 159.1 lb

## 2014-08-12 DIAGNOSIS — Z01419 Encounter for gynecological examination (general) (routine) without abnormal findings: Secondary | ICD-10-CM | POA: Diagnosis not present

## 2014-08-12 DIAGNOSIS — Z1151 Encounter for screening for human papillomavirus (HPV): Secondary | ICD-10-CM | POA: Insufficient documentation

## 2014-08-12 DIAGNOSIS — E663 Overweight: Secondary | ICD-10-CM

## 2014-08-12 DIAGNOSIS — Z0000 Encounter for general adult medical examination without abnormal findings: Secondary | ICD-10-CM

## 2014-08-12 LAB — CBC WITH DIFFERENTIAL/PLATELET
BASOS PCT: 0.6 % (ref 0.0–3.0)
Basophils Absolute: 0 10*3/uL (ref 0.0–0.1)
Eosinophils Absolute: 0.2 10*3/uL (ref 0.0–0.7)
Eosinophils Relative: 2.9 % (ref 0.0–5.0)
HCT: 40.7 % (ref 36.0–46.0)
Hemoglobin: 13.8 g/dL (ref 12.0–15.0)
LYMPHS PCT: 39.9 % (ref 12.0–46.0)
Lymphs Abs: 2.4 10*3/uL (ref 0.7–4.0)
MCHC: 33.8 g/dL (ref 30.0–36.0)
MCV: 82.8 fl (ref 78.0–100.0)
MONO ABS: 0.3 10*3/uL (ref 0.1–1.0)
MONOS PCT: 5.8 % (ref 3.0–12.0)
NEUTROS PCT: 50.8 % (ref 43.0–77.0)
Neutro Abs: 3 10*3/uL (ref 1.4–7.7)
PLATELETS: 272 10*3/uL (ref 150.0–400.0)
RBC: 4.91 Mil/uL (ref 3.87–5.11)
RDW: 13.8 % (ref 11.5–15.5)
WBC: 6 10*3/uL (ref 4.0–10.5)

## 2014-08-12 LAB — MICROALBUMIN / CREATININE URINE RATIO
Creatinine,U: 101.8 mg/dL
MICROALB/CREAT RATIO: 0.7 mg/g (ref 0.0–30.0)
Microalb, Ur: <0.7 mg/dL (ref 0.0–1.9)

## 2014-08-12 LAB — LIPID PANEL
Cholesterol: 155 mg/dL (ref 0–200)
HDL: 42.3 mg/dL (ref >39.00)
LDL CALC: 93 mg/dL (ref 0–99)
NonHDL: 112.7
Total CHOL/HDL Ratio: 4
Triglycerides: 101 mg/dL (ref 0.0–149.0)
VLDL: 20.2 mg/dL (ref 0.0–40.0)

## 2014-08-12 LAB — VITAMIN D 25 HYDROXY (VIT D DEFICIENCY, FRACTURES): VITD: 25.87 ng/mL — AB (ref 30.00–100.00)

## 2014-08-12 LAB — COMPREHENSIVE METABOLIC PANEL
ALK PHOS: 55 U/L (ref 39–117)
ALT: 21 U/L (ref 0–35)
AST: 22 U/L (ref 0–37)
Albumin: 4.3 g/dL (ref 3.5–5.2)
BUN: 14 mg/dL (ref 6–23)
CALCIUM: 9.1 mg/dL (ref 8.4–10.5)
CO2: 30 mEq/L (ref 19–32)
CREATININE: 0.71 mg/dL (ref 0.40–1.20)
Chloride: 102 mEq/L (ref 96–112)
GFR: 89.83 mL/min (ref >60.00)
Glucose, Bld: 95 mg/dL (ref 70–99)
POTASSIUM: 3.3 meq/L — AB (ref 3.5–5.1)
SODIUM: 138 meq/L (ref 135–145)
Total Bilirubin: 0.4 mg/dL (ref 0.2–1.2)
Total Protein: 6.9 g/dL (ref 6.0–8.3)

## 2014-08-12 LAB — TSH: TSH: 0.97 u[IU]/mL (ref 0.35–4.50)

## 2014-08-12 LAB — HM PAP SMEAR: HM Pap smear: NEGATIVE

## 2014-08-12 MED ORDER — PHENTERMINE HCL 37.5 MG PO CAPS: 37.5000 mg | ORAL_CAPSULE | ORAL | Status: DC

## 2014-08-12 NOTE — Assessment & Plan Note (Signed)
General medical exam including breast and pelvic exam normal today. PAP pending. Mammogram reviewed and UTD. Colonoscopy UTD. Encouraged healthy diet and exercise. Flu vaccine declined.

## 2014-08-12 NOTE — Progress Notes (Signed)
Subjective:    Patient ID: Jennifer Gardner, female    DOB: 1955-12-21, 59 y.o.   MRN: [id number]  HPI  59YO female presents for annual exam.  Generally feeling well, however concerned about weight gain. Trying to follow healthy diet. Exercise has been limited after foot surgery.  Wt Readings from Last 3 Encounters:  08/12/14 159 lb 2 oz (72.179 kg)  07/14/14 158 lb (71.668 kg)  12/29/13 156 lb (70.761 kg)    Past medical, surgical, family and social history per today's encounter.  Review of Systems  Constitutional: Negative for fever, chills, appetite change, fatigue and unexpected weight change.  Eyes: Negative for visual disturbance.  Respiratory: Negative for shortness of breath.   Cardiovascular: Negative for chest pain and leg swelling.  Gastrointestinal: Negative for nausea, vomiting, abdominal pain, diarrhea and constipation.  Musculoskeletal: Negative for arthralgias.  Skin: Negative for color change and rash.  Neurological: Positive for tremors.  Hematological: Negative for adenopathy. Does not bruise/bleed easily.  Psychiatric/Behavioral: Negative for dysphoric mood. The patient is not nervous/anxious.        Objective:    BP 101/63 mmHg  Pulse 66  Temp(Src) 98 F (36.7 C) (Oral)  Ht 5' 2.25" (1.581 m)  Wt 159 lb 2 oz (72.179 kg)  BMI 28.88 kg/m2  SpO2 98% Physical Exam  Constitutional: She is oriented to person, place, and time. She appears well-developed and well-nourished. No distress.  HENT:  Head: Normocephalic and atraumatic.  Right Ear: External ear normal.  Left Ear: External ear normal.  Nose: Nose normal.  Mouth/Throat: Oropharynx is clear and moist. No oropharyngeal exudate.  Eyes: Conjunctivae are normal. Pupils are equal, round, and reactive to light. Right eye exhibits no discharge. Left eye exhibits no discharge. No scleral icterus.  Neck: Normal range of motion. Neck supple. No tracheal deviation present. No thyromegaly present.    Cardiovascular: Normal rate, regular rhythm, normal heart sounds and intact distal pulses.  Exam reveals no gallop and no friction rub.   No murmur heard. Pulmonary/Chest: Effort normal and breath sounds normal. No respiratory distress. She has no wheezes. She has no rales. She exhibits no tenderness.  Abdominal: Soft. Bowel sounds are normal. She exhibits no distension and no mass. There is no tenderness. There is no rebound and no guarding.  Genitourinary: Rectum normal, vagina normal and uterus normal. No breast swelling, tenderness, discharge or bleeding. Pelvic exam was performed with patient supine. There is no rash, tenderness or lesion on the right labia. There is no rash, tenderness or lesion on the left labia. Uterus is not enlarged and not tender. Cervix exhibits no motion tenderness, no discharge and no friability. Right adnexum displays no mass, no tenderness and no fullness. Left adnexum displays no mass, no tenderness and no fullness. No erythema or tenderness in the vagina. No vaginal discharge found.  Musculoskeletal: Normal range of motion. She exhibits no edema or tenderness.  Lymphadenopathy:    She has no cervical adenopathy.  Neurological: She is alert and oriented to person, place, and time. No cranial nerve deficit. She exhibits normal muscle tone. Coordination normal.  Skin: Skin is warm and dry. No rash noted. She is not diaphoretic. No erythema. No pallor.  Psychiatric: She has a normal mood and affect. Her behavior is normal. Judgment and thought content normal.          Assessment & Plan:   Problem List Items Addressed This Visit      Unprioritized   Overweight (BMI 25.0-29.9)  Wt Readings from Last 3 Encounters:  08/12/14 159 lb 2 oz (72.179 kg)  07/14/14 158 lb (71.668 kg)  12/29/13 156 lb (70.761 kg)   Body mass index is 28.88 kg/(m^2). Encouraged healthy diet and exercise. Discussed options for medication to help with weight loss. Will start  Phentermine. She understands risks of this medication. Follow up BP check in 2 weeks.      Relevant Medications   phentermine capsule   Routine general medical examination at a health care facility - Primary    General medical exam including breast and pelvic exam normal today. PAP pending. Mammogram reviewed and UTD. Colonoscopy UTD. Encouraged healthy diet and exercise. Flu vaccine declined.      Relevant Orders   TSH   CBC with Differential/Platelet   Comprehensive metabolic panel   Lipid panel   Vit D  25 hydroxy (rtn osteoporosis monitoring)   Microalbumin / creatinine urine ratio       Return in about 2 weeks (around 08/26/2014) for Recheck of Blood Pressure.

## 2014-08-12 NOTE — Assessment & Plan Note (Signed)
Wt Readings from Last 3 Encounters:  08/12/14 159 lb 2 oz (72.179 kg)  07/14/14 158 lb (71.668 kg)  12/29/13 156 lb (70.761 kg)   Body mass index is 28.88 kg/(m^2). Encouraged healthy diet and exercise. Discussed options for medication to help with weight loss. Will start Phentermine. She understands risks of this medication. Follow up BP check in 2 weeks.

## 2014-08-12 NOTE — Patient Instructions (Signed)
Start Phentermine 37.6m daily to help control appetite. Follow up in 2 weeks to recheck BP.  Health Maintenance Adopting a healthy lifestyle and getting preventive care can go a long way to promote health and wellness. Talk with your health care provider about what schedule of regular examinations is right for you. This is a good chance for you to check in with your provider about disease prevention and staying healthy. In between checkups, there are plenty of things you can do on your own. Experts have done a lot of research about which lifestyle changes and preventive measures are most likely to keep you healthy. Ask your health care provider for more information. WEIGHT AND DIET  Eat a healthy diet  Be sure to include plenty of vegetables, fruits, low-fat dairy products, and lean protein.  Do not eat a lot of foods high in solid fats, added sugars, or salt.  Get regular exercise. This is one of the most important things you can do for your health.  Most adults should exercise for at least 150 minutes each week. The exercise should increase your heart rate and make you sweat (moderate-intensity exercise).  Most adults should also do strengthening exercises at least twice a week. This is in addition to the moderate-intensity exercise.  Maintain a healthy weight  Body mass index (BMI) is a measurement that can be used to identify possible weight problems. It estimates body fat based on height and weight. Your health care provider can help determine your BMI and help you achieve or maintain a healthy weight.  For females 266years of age and older:   A BMI below 18.5 is considered underweight.  A BMI of 18.5 to 24.9 is normal.  A BMI of 25 to 29.9 is considered overweight.  A BMI of 30 and above is considered obese.  Watch levels of cholesterol and blood lipids  You should start having your blood tested for lipids and cholesterol at 58years of age, then have this test every 5  years.  You may need to have your cholesterol levels checked more often if:  Your lipid or cholesterol levels are high.  You are older than 58years of age.  You are at high risk for heart disease.  CANCER SCREENING   Lung Cancer  Lung cancer screening is recommended for adults 572848years old who are at high risk for lung cancer because of a history of smoking.  A yearly low-dose CT scan of the lungs is recommended for people who:  Currently smoke.  Have quit within the past 15 years.  Have at least a 30-pack-year history of smoking. A pack year is smoking an average of one pack of cigarettes a day for 1 year.  Yearly screening should continue until it has been 15 years since you quit.  Yearly screening should stop if you develop a health problem that would prevent you from having lung cancer treatment.  Breast Cancer  Practice breast self-awareness. This means understanding how your breasts normally appear and feel.  It also means doing regular breast self-exams. Let your health care provider know about any changes, no matter how small.  If you are in your 20s or 30s, you should have a clinical breast exam (CBE) by a health care provider every 1-3 years as part of a regular health exam.  If you are 444or older, have a CBE every year. Also consider having a breast X-ray (mammogram) every year.  If you have a family  history of breast cancer, talk to your health care provider about genetic screening.  If you are at high risk for breast cancer, talk to your health care provider about having an MRI and a mammogram every year.  Breast cancer gene (BRCA) assessment is recommended for women who have family members with BRCA-related cancers. BRCA-related cancers include:  Breast.  Ovarian.  Tubal.  Peritoneal cancers.  Results of the assessment will determine the need for genetic counseling and BRCA1 and BRCA2 testing. Cervical Cancer Routine pelvic examinations to  screen for cervical cancer are no longer recommended for nonpregnant women who are considered low risk for cancer of the pelvic organs (ovaries, uterus, and vagina) and who do not have symptoms. A pelvic examination may be necessary if you have symptoms including those associated with pelvic infections. Ask your health care provider if a screening pelvic exam is right for you.   The Pap test is the screening test for cervical cancer for women who are considered at risk.  If you had a hysterectomy for a problem that was not cancer or a condition that could lead to cancer, then you no longer need Pap tests.  If you are older than 65 years, and you have had normal Pap tests for the past 10 years, you no longer need to have Pap tests.  If you have had past treatment for cervical cancer or a condition that could lead to cancer, you need Pap tests and screening for cancer for at least 20 years after your treatment.  If you no longer get a Pap test, assess your risk factors if they change (such as having a new sexual partner). This can affect whether you should start being screened again.  Some women have medical problems that increase their chance of getting cervical cancer. If this is the case for you, your health care provider may recommend more frequent screening and Pap tests.  The human papillomavirus (HPV) test is another test that may be used for cervical cancer screening. The HPV test looks for the virus that can cause cell changes in the cervix. The cells collected during the Pap test can be tested for HPV.  The HPV test can be used to screen women 57 years of age and older. Getting tested for HPV can extend the interval between normal Pap tests from three to five years.  An HPV test also should be used to screen women of any age who have unclear Pap test results.  After 58 years of age, women should have HPV testing as often as Pap tests.  Colorectal Cancer  This type of cancer can be  detected and often prevented.  Routine colorectal cancer screening usually begins at 58 years of age and continues through 58 years of age.  Your health care provider may recommend screening at an earlier age if you have risk factors for colon cancer.  Your health care provider may also recommend using home test kits to check for hidden blood in the stool.  A small camera at the end of a tube can be used to examine your colon directly (sigmoidoscopy or colonoscopy). This is done to check for the earliest forms of colorectal cancer.  Routine screening usually begins at age 66.  Direct examination of the colon should be repeated every 5-10 years through 58 years of age. However, you may need to be screened more often if early forms of precancerous polyps or small growths are found. Skin Cancer  Check your skin from  head to toe regularly.  Tell your health care provider about any new moles or changes in moles, especially if there is a change in a mole's shape or color.  Also tell your health care provider if you have a mole that is larger than the size of a pencil eraser.  Always use sunscreen. Apply sunscreen liberally and repeatedly throughout the day.  Protect yourself by wearing long sleeves, pants, a wide-brimmed hat, and sunglasses whenever you are outside. HEART DISEASE, DIABETES, AND HIGH BLOOD PRESSURE   Have your blood pressure checked at least every 1-2 years. High blood pressure causes heart disease and increases the risk of stroke.  If you are between 35 years and 50 years old, ask your health care provider if you should take aspirin to prevent strokes.  Have regular diabetes screenings. This involves taking a blood sample to check your fasting blood sugar level.  If you are at a normal weight and have a low risk for diabetes, have this test once every three years after 58 years of age.  If you are overweight and have a high risk for diabetes, consider being tested at a  younger age or more often. PREVENTING INFECTION  Hepatitis B  If you have a higher risk for hepatitis B, you should be screened for this virus. You are considered at high risk for hepatitis B if:  You were born in a country where hepatitis B is common. Ask your health care provider which countries are considered high risk.  Your parents were born in a high-risk country, and you have not been immunized against hepatitis B (hepatitis B vaccine).  You have HIV or AIDS.  You use needles to inject street drugs.  You live with someone who has hepatitis B.  You have had sex with someone who has hepatitis B.  You get hemodialysis treatment.  You take certain medicines for conditions, including cancer, organ transplantation, and autoimmune conditions. Hepatitis C  Blood testing is recommended for:  Everyone born from [birth year] through 1964.  Anyone with known risk factors for hepatitis C. Sexually transmitted infections (STIs)  You should be screened for sexually transmitted infections (STIs) including gonorrhea and chlamydia if:  You are sexually active and are younger than 58 years of age.  You are older than 58 years of age and your health care provider tells you that you are at risk for this type of infection.  Your sexual activity has changed since you were last screened and you are at an increased risk for chlamydia or gonorrhea. Ask your health care provider if you are at risk.  If you do not have HIV, but are at risk, it may be recommended that you take a prescription medicine daily to prevent HIV infection. This is called pre-exposure prophylaxis (PrEP). You are considered at risk if:  You are sexually active and do not regularly use condoms or know the HIV status of your partner(s).  You take drugs by injection.  You are sexually active with a partner who has HIV. Talk with your health care provider about whether you are at high risk of being infected with HIV. If you choose  to begin PrEP, you should first be tested for HIV. You should then be tested every 3 months for as long as you are taking PrEP.  PREGNANCY   If you are premenopausal and you may become pregnant, ask your health care provider about preconception counseling.  If you may become pregnant, take 400 to 800  micrograms (mcg) of folic acid every day.  If you want to prevent pregnancy, talk to your health care provider about birth control (contraception). OSTEOPOROSIS AND MENOPAUSE   Osteoporosis is a disease in which the bones lose minerals and strength with aging. This can result in serious bone fractures. Your risk for osteoporosis can be identified using a bone density scan.  If you are 70 years of age or older, or if you are at risk for osteoporosis and fractures, ask your health care provider if you should be screened.  Ask your health care provider whether you should take a calcium or vitamin D supplement to lower your risk for osteoporosis.  Menopause may have certain physical symptoms and risks.  Hormone replacement therapy may reduce some of these symptoms and risks. Talk to your health care provider about whether hormone replacement therapy is right for you.  HOME CARE INSTRUCTIONS   Schedule regular health, dental, and eye exams.  Stay current with your immunizations.   Do not use any tobacco products including cigarettes, chewing tobacco, or electronic cigarettes.  If you are pregnant, do not drink alcohol.  If you are breastfeeding, limit how much and how often you drink alcohol.  Limit alcohol intake to no more than 1 drink per day for nonpregnant women. One drink equals 12 ounces of beer, 5 ounces of wine, or 1 ounces of hard liquor.  Do not use street drugs.  Do not share needles.  Ask your health care provider for help if you need support or information about quitting drugs.  Tell your health care provider if you often feel depressed.  Tell your health care  provider if you have ever been abused or do not feel safe at home. Document Released: 12/17/2010 Document Revised: 10/18/2013 Document Reviewed: 05/05/2013 [facility] Patient Information 2015 [place], [place]. This information is not intended to replace advice given to you by your health care provider. Make sure you discuss any questions you have with your health care provider.

## 2014-08-12 NOTE — Addendum Note (Signed)
Addended by: Karlene Einstein D on: 08/12/2014 01:58 PM   Modules accepted: Orders

## 2014-08-12 NOTE — Progress Notes (Signed)
Pre visit review using our clinic review tool, if applicable. No additional management support is needed unless otherwise documented below in the visit note. 

## 2014-08-16 ENCOUNTER — Encounter: Payer: Self-pay | Admitting: *Deleted

## 2014-08-16 LAB — CYTOLOGY - PAP

## 2014-08-22 ENCOUNTER — Other Ambulatory Visit: Payer: Self-pay | Admitting: Neurology

## 2014-08-22 MED ORDER — TOPIRAMATE 100 MG PO TABS: 100.0000 mg | ORAL_TABLET | Freq: Two times a day (BID) | ORAL | Status: DC

## 2014-08-22 NOTE — Telephone Encounter (Signed)
Topiramate refill requested. Per last office note- patient to remain on medication. Refill approved and sent to patient's pharmacy.

## 2014-10-08 NOTE — Op Note (Signed)
PATIENT NAME:  Jennifer Gardner, Jennifer Gardner MR#:  [id number] DATE OF BIRTH:  07-24-1955  DATE OF PROCEDURE:  07/08/2013  PREOPERATIVE DIAGNOSES:  1. Hallux rigidus, left foot.  2. Metatarsus primus varus, left foot.  3. Exostosis, fifth toe, left foot.  4. Exostosis, third toe, right foot.  5. Exostosis, fourth toe, right foot.  6. Exostosis, fifth toe, right foot.  7. Ingrown toenail, right hallux.  8. Ingrown toenail, left hallux.  POSTOPERATIVE  DIAGNOSES:  1. Hallux rigidus, left foot.  2. Metatarsus primus varus, left foot.  3. Exostosis, fifth toe, left foot.  4. Exostosis, third toe, right foot.  5. Exostosis, fourth toe, right foot.  6. Exostosis, fifth toe, right foot.  7. Ingrown toenail, right hallux.  8. Ingrown toenail, left hallux.  PROCEDURES: Include: 1. Cheilectomy, left first metatarsophalangeal joint.  2. Left first metatarsal osteotomy with K wire fixation.  3. Arthroplasty with phalanx partial resection, fifth toe, left; third, fourth and fifth toes, right.  4. Excision of ingrown toenail both borders, right hallux, and both borders, left hallux.   SURGEON: Gerrit Heck. Jashawna Reever, DPM  ASSISTANT: None.   HISTORY OF PRESENT ILLNESS: The patient has had chronic pain in both feet for a long time. She desires surgical correction as various conservative treatments have proven ineffective.   ANESTHESIA: General with local anesthesia support.   ANESTHESIOLOGIST: Mathai S. Marcello Moores, MD  ESTIMATED BLOOD LOSS: Negligible.   HEMOSTASIS: Ankle tourniquet 250 mmHg pressure.   OPERATIVE REPORT: The patient was brought to the OR and placed on the OR table in the supine position. At this point, after general anesthesia was achieved by the anesthesia team and local anesthesia was achieved by myself, the patient was prepped and draped in the usual sterile manner for both feet. Attention was then directed to the left first MTP joint. After the tourniquet had been inflated and the foot  exsanguinated, a 4 cm dorsal linear skin incision was made at the first MTP joint. This incision was deepened with sharp and blunt dissection. Bleeders were clamped and bovied as required. Large spurring was noted on the dorsal and dorsal medial aspect of the first metatarsal head. This was resected and rasped smoothly with power equipment. All ridges and spurring were removed from the joint. The patient still had pretty good articular cartilage across both portions of it. Once the ridges and spurring were removed, an osteotomy was performed of the metatarsal, apex distal and base proximal. Two cuts were made on the dorsal aspect to enable some shortening, but also some lateral rotation of the metatarsal head. That worked out nicely, and once that dorsal wedge of bone was removed, the osteotomy was closed and fixated with 0.062 K wire. The area was checked with FluoroScan, and good position and correction were noted. The area was then copiously irrigated, and capsular tissue was then closed with 4-0 Vicryl in a continuous stitch as were deep and superficial fascial layers. Skin was closed with 4-0 Vicryl in a subcuticular stitch.   At this time, attention was directed to the fifth toe of the left foot, where 2 semielliptical incisions were made over the fifth toe. This ellipse of skin was then removed. The extensor tendon was identified, incised transversely and reflected proximally. The head of the proximal phalanx was then resected with power equipment, and then tissue was freed away from the lateral middle and distal phalanges, and these were resected and rasped smoothly as well. After copious irrigation, the extensor tendon was then  closed with 4-0 Vicryl in a simple interrupted suture, 3 different sutures, and then the skin was closed with 5-0 nylon in a horizontal mattress and simple interrupted combination.   At this time, attention was directed to the fifth toe of the right foot, where a similar  procedure was performed, except the bone was removed from the middle and distal phalanges on the medial side. Closure was similar to the fifth toe on the left foot.   At this time, attention was directed to the fourth toe of the right foot, where a linear incision was made over the PIP joint dorsally. Soft tissue was freed away from the lateral condyle of the proximal phalanx, and a power rasp was used to smooth down the prominence of bone and the exostosis in that region. Once enough of this was achieved, the area was checked with FluoroScan, and good correction was noted. The area was then copiously irrigated. The capsular tissue was closed with 4-0 Vicryl in a simple interrupted stitch, and skin was closed with 5-0 nylon in horizontal mattress sutures.   At this time, attention was directed to the third toe of the right foot, where 2 semielliptical incisions were made transversely across the DIP joint. This ellipse of skin was then removed. The extensor tendon was identified, incised transversely and reflected proximally. The head of the middle phalanx was noted to be very arthritic. This was then resected with power equipment as was a portion of the base of the distal phalanx. Once this was accomplished, the area was copiously irrigated. The extensor tendon was reapproximated with 4-0 Vicryl. I did have to drill a couple of holes in the distal phalanx to anchor that extensor tendon into because of the tissue damage from the arthritis on that toe. The area was then copiously irrigated before skin was closed with 5-0 nylon horizontal mattress and simple interrupted combination.   At this time, attention was directed to the right and left hallucal nail borders. The tibial and fibular margins were identified and removed with sharp and blunt removal, exposing the matrix. At this point, phenol was used to apply to the matrices 3 x 90 seconds each. Prior to starting these surgeries, the bandages had been placed  across the other surgical areas, and tourniquets had been released. Penrose drains were used for tourniquets on the hallux areas for the phenol alcohol procedure. Once the phenol had been applied for an appropriate amount of time, the area was irrigated with alcohol, dressed with Neosporin, Xeroform gauze, 4 x 4's and Kerlix. The patient appeared to tolerate the procedure and anesthesia well and left the OR for the recovery room with vital signs stable and neurovascular status intact.   ____________________________ Gerrit Heck. Roselyn Doby, DPM mgt:lb D: 07/08/2013 12:12:42 ET T: 07/08/2013 12:22:31 ET JOB#: [id number]  cc: Gerrit Heck Shriya Aker, DPM, <Dictator> Perry Mount MD ELECTRONICALLY SIGNED 07/13/2013 13:38

## 2014-10-11 ENCOUNTER — Other Ambulatory Visit: Payer: Self-pay | Admitting: Internal Medicine

## 2014-12-16 ENCOUNTER — Other Ambulatory Visit: Payer: Self-pay | Admitting: Internal Medicine

## 2015-01-05 ENCOUNTER — Other Ambulatory Visit: Payer: Self-pay | Admitting: Internal Medicine

## 2015-01-07 ENCOUNTER — Other Ambulatory Visit: Payer: Self-pay | Admitting: Internal Medicine

## 2015-01-20 ENCOUNTER — Encounter: Payer: Self-pay | Admitting: *Deleted

## 2015-01-24 NOTE — Discharge Instructions (Signed)
[facility] °[facility] ° °POST OPERATIVE INSTRUCTIONS FOR DR. TROXLER AND DR. FOWLER °[facility] ° ° °1. Take your medication as prescribed.  Pain medication should be taken only as needed. ° °2. Keep the dressing clean, dry and intact. ° °3. Keep your foot elevated above the heart level for the first 48 hours. ° °4. Walking to the bathroom and brief periods of walking are acceptable, unless we have instructed you to be non-weight bearing. ° °5. Always wear your post-op shoe when walking.  Always use your crutches if you are to be non-weight bearing. ° °6. Do not take a shower. Baths are permissible as long as the foot is kept out of the water.  ° °7. Every hour you are awake:  °- Bend your knee 15 times. °- Flex foot 15 times °- Massage calf 15 times ° °8. Call [facility] ([phone]) if any of the following problems occur: °- You develop a temperature or fever. °- The bandage becomes saturated with blood. °- Medication does not stop your pain. °- Injury of the foot occurs. °- Any symptoms of infection including redness, odor, or red streaks running from wound. °-  ° °General Anesthesia, Care After °Refer to this sheet in the next few weeks. These instructions provide you with information on caring for yourself after your procedure. Your health care provider may also give you more specific instructions. Your treatment has been planned according to current medical practices, but problems sometimes occur. Call your health care provider if you have any problems or questions after your procedure. °WHAT TO EXPECT AFTER THE PROCEDURE °After the procedure, it is typical to experience: °· Sleepiness. °· Nausea and vomiting. °HOME CARE INSTRUCTIONS °· For the first 24 hours after general anesthesia: °¨ Have a responsible person with you. °¨ Do not drive a car. If you are alone, do not take public transportation. °¨ Do not drink alcohol. °¨ Do not take medicine  that has not been prescribed by your health care provider. °¨ Do not sign important papers or make important decisions. °¨ You may resume a normal diet and activities as directed by your health care provider. °· Change bandages (dressings) as directed. °· If you have questions or problems that seem related to general anesthesia, call the hospital and ask for the anesthetist or anesthesiologist on call. °SEEK MEDICAL CARE IF: °· You have nausea and vomiting that continue the day after anesthesia. °· You develop a rash. °SEEK IMMEDIATE MEDICAL CARE IF:  °· You have difficulty breathing. °· You have chest pain. °· You have any allergic problems. °Document Released: 09/09/2000 Document Revised: 06/08/2013 Document Reviewed: 12/17/2012 °[employer]® Patient Information ©2015 [employer]. This information is not intended to replace advice given to you by your health care provider. Make sure you discuss any questions you have with your health care provider. ° °

## 2015-01-26 ENCOUNTER — Encounter
Admission: RE | Disposition: A | Discharge status: Home / Self Care | Payer: Self-pay | Source: Ambulatory Visit | Attending: Podiatry

## 2015-01-26 ENCOUNTER — Ambulatory Visit
Admission: RE | Admit: 2015-01-26 | Discharge: 2015-01-26 | Disposition: A | Discharge status: Home / Self Care | Payer: BLUE CROSS/BLUE SHIELD | Source: Ambulatory Visit | Attending: Podiatry | Admitting: Podiatry

## 2015-01-26 ENCOUNTER — Ambulatory Visit: Payer: BLUE CROSS/BLUE SHIELD | Admitting: Anesthesiology

## 2015-01-26 ENCOUNTER — Encounter: Payer: Self-pay | Admitting: *Deleted

## 2015-01-26 DIAGNOSIS — M205X2 Other deformities of toe(s) (acquired), left foot: Secondary | ICD-10-CM | POA: Insufficient documentation

## 2015-01-26 DIAGNOSIS — I10 Essential (primary) hypertension: Secondary | ICD-10-CM | POA: Insufficient documentation

## 2015-01-26 DIAGNOSIS — M2042 Other hammer toe(s) (acquired), left foot: Secondary | ICD-10-CM | POA: Insufficient documentation

## 2015-01-26 DIAGNOSIS — F329 Major depressive disorder, single episode, unspecified: Secondary | ICD-10-CM | POA: Insufficient documentation

## 2015-01-26 DIAGNOSIS — M1990 Unspecified osteoarthritis, unspecified site: Secondary | ICD-10-CM | POA: Diagnosis not present

## 2015-01-26 HISTORY — DX: Presence of spectacles and contact lenses: Z97.3

## 2015-01-26 HISTORY — PX: WEIL OSTEOTOMY: SHX5044

## 2015-01-26 HISTORY — DX: Personal history of other diseases of the nervous system and sense organs: Z86.69

## 2015-01-26 HISTORY — DX: Unspecified osteoarthritis, unspecified site: M19.90

## 2015-01-26 HISTORY — PX: HAMMER TOE SURGERY: SHX385

## 2015-01-26 SURGERY — CORRECTION, HAMMER TOE
Anesthesia: General | Laterality: Left | Wound class: Clean

## 2015-01-26 MED ORDER — DEXAMETHASONE SODIUM PHOSPHATE 4 MG/ML IJ SOLN
INTRAMUSCULAR | Status: DC | PRN
  Administered 2015-01-26: 8 mg via INTRAVENOUS

## 2015-01-26 MED ORDER — ONDANSETRON HCL 4 MG/2ML IJ SOLN
INTRAMUSCULAR | Status: DC | PRN
  Administered 2015-01-26: 4 mg via INTRAVENOUS

## 2015-01-26 MED ORDER — OXYCODONE HCL 5 MG/5ML PO SOLN: 5.0000 mg | Freq: Once | ORAL | Status: DC | PRN

## 2015-01-26 MED ORDER — SODIUM CHLORIDE 0.9 % IV SOLN
600.0000 mg | Freq: Once | INTRAVENOUS | Status: AC
  Administered 2015-01-26: 600 mg via INTRAVENOUS

## 2015-01-26 MED ORDER — LACTATED RINGERS IV SOLN
INTRAVENOUS | Status: DC
  Administered 2015-01-26: 07:00:00 via INTRAVENOUS

## 2015-01-26 MED ORDER — PROPOFOL 10 MG/ML IV BOLUS
INTRAVENOUS | Status: DC | PRN
  Administered 2015-01-26: 150 mg via INTRAVENOUS

## 2015-01-26 MED ORDER — HYDROMORPHONE HCL 1 MG/ML IJ SOLN: 0.2500 mg | INTRAMUSCULAR | Status: DC | PRN

## 2015-01-26 MED ORDER — BUPIVACAINE HCL (PF) 0.5 % IJ SOLN
INTRAMUSCULAR | Status: DC | PRN
  Administered 2015-01-26: 5 mL
  Administered 2015-01-26: 3 mL

## 2015-01-26 MED ORDER — OXYCODONE-ACETAMINOPHEN 7.5-325 MG PO TABS: 1.0000 | ORAL_TABLET | ORAL | Status: DC | PRN

## 2015-01-26 MED ORDER — LIDOCAINE HCL (CARDIAC) 20 MG/ML IV SOLN
INTRAVENOUS | Status: DC | PRN
  Administered 2015-01-26: 40 mg via INTRATRACHEAL

## 2015-01-26 MED ORDER — OXYCODONE HCL 5 MG PO TABS: 5.0000 mg | ORAL_TABLET | Freq: Once | ORAL | Status: DC | PRN

## 2015-01-26 MED ORDER — FENTANYL CITRATE (PF) 100 MCG/2ML IJ SOLN
INTRAMUSCULAR | Status: DC | PRN
Start: 2015-01-26 — End: 2015-01-26
  Administered 2015-01-26 (×2): 50 ug via INTRAVENOUS

## 2015-01-26 MED ORDER — ONDANSETRON HCL 4 MG/2ML IJ SOLN: 4.0000 mg | Freq: Once | INTRAMUSCULAR | Status: DC | PRN

## 2015-01-26 MED ORDER — MIDAZOLAM HCL 5 MG/5ML IJ SOLN
INTRAMUSCULAR | Status: DC | PRN
  Administered 2015-01-26: 2 mg via INTRAVENOUS

## 2015-01-26 SURGICAL SUPPLY — 70 items
APL SKNCLS STERI-STRIP NONHPOA (GAUZE/BANDAGES/DRESSINGS) ×1
BANDAGE ELASTIC 4 CLIP NS LF (GAUZE/BANDAGES/DRESSINGS) ×2 IMPLANT
BENZOIN TINCTURE PRP APPL 2/3 (GAUZE/BANDAGES/DRESSINGS) ×2 IMPLANT
BLADE CRESCENTIC (BLADE) IMPLANT
BLADE MED AGGRESSIVE (BLADE) ×2 IMPLANT
BLADE MINI RND TIP GREEN BEAV (BLADE) ×2 IMPLANT
BLADE OSC/SAGITTAL 5.5X25 (BLADE) IMPLANT
BLADE OSC/SAGITTAL MD 5.5X18 (BLADE) ×2 IMPLANT
BLADE OSC/SAGITTAL MD 9X18.5 (BLADE) ×2 IMPLANT
BLADE OSCILLATING/SAGITTAL (BLADE)
BLADE SURG 15 STRL LF DISP TIS (BLADE) IMPLANT
BLADE SURG 15 STRL SS (BLADE)
BLADE SW THK.38XMED LNG THN (BLADE) IMPLANT
BNDG CMPR 75X41 PLY HI ABS (GAUZE/BANDAGES/DRESSINGS) ×1
BNDG ESMARK 4X12 TAN STRL LF (GAUZE/BANDAGES/DRESSINGS) ×2 IMPLANT
BNDG GAUZE 4.5X4.1 6PLY STRL (MISCELLANEOUS) ×2 IMPLANT
BNDG STRETCH 4X75 STRL LF (GAUZE/BANDAGES/DRESSINGS) ×2 IMPLANT
BUR EGG 4X8 MED (BURR) IMPLANT
BUR STRYKR EGG 5.0 (BURR) IMPLANT
CANISTER SUCT 1200ML W/VALVE (MISCELLANEOUS) ×2 IMPLANT
CAST PADDING 3X4FT ST 30246 (SOFTGOODS)
COVER PIN YLW 0.028-062 (MISCELLANEOUS) IMPLANT
CUFF TOURN SGL QUICK 18 (TOURNIQUET CUFF) IMPLANT
DRAPE FLUOR MINI C-ARM 54X84 (DRAPES) ×2 IMPLANT
DRILL WIRE PASS (DRILL) IMPLANT
DURAPREP 26ML APPLICATOR (WOUND CARE) ×2 IMPLANT
ETHIBOND 2 0 GREEN CT 2 30IN (SUTURE) IMPLANT
GAUZE PETRO XEROFOAM 1X8 (MISCELLANEOUS) ×2 IMPLANT
GAUZE PETRO XEROFOAM 5X9 (MISCELLANEOUS) IMPLANT
GAUZE SPONGE 4X4 12PLY STRL (GAUZE/BANDAGES/DRESSINGS) ×2 IMPLANT
GLOVE BIO SURGEON STRL SZ7.5 (GLOVE) ×2 IMPLANT
GLOVE BIO SURGEON STRL SZ8 (GLOVE) ×2 IMPLANT
GLOVE INDICATOR 8.0 STRL GRN (GLOVE) ×2 IMPLANT
GOWN STRL REUS W/ TWL XL LVL3 (GOWN DISPOSABLE) ×2 IMPLANT
GOWN STRL REUS W/TWL XL LVL3 (GOWN DISPOSABLE) ×2
K-WIRE DBL END TROCAR 6X.045 (WIRE) ×2
K-WIRE DBL END TROCAR 6X.062 (WIRE) ×2
K-WIRE TROCAR .8X100 (WIRE) ×2 IMPLANT
K-wire 0.045 ×2 IMPLANT
KWIRE DBL END TROCAR 6X.045 (WIRE) ×1 IMPLANT
KWIRE DBL END TROCAR 6X.062 (WIRE) ×1 IMPLANT
NEEDLE HYPO 18GX1.5 BLUNT FILL (NEEDLE) IMPLANT
NEEDLE HYPO 25GX1X1/2 BEV (NEEDLE) IMPLANT
PACK EXTREMITY ARMC (MISCELLANEOUS) ×2 IMPLANT
PAD CAST CTTN 3X4 STRL (SOFTGOODS) IMPLANT
PAD GROUND ADULT SPLIT (MISCELLANEOUS) ×2 IMPLANT
PADDING CAST COTTON 3X4 STRL (SOFTGOODS)
RASP SM TEAR CROSS CUT (RASP) ×2 IMPLANT
SCREW COMP SH THR 2.2X10 (Screw) ×2 IMPLANT
SCREW COMP SH THR 2.2X12 (Screw) ×2 IMPLANT
SPLINT CAST 1 STEP 4X30 (MISCELLANEOUS) ×2 IMPLANT
SPLINT FAST PLASTER 5X30 (CAST SUPPLIES)
SPLINT PLASTER CAST FAST 5X30 (CAST SUPPLIES) IMPLANT
STOCKINETTE STRL 6IN 960660 (GAUZE/BANDAGES/DRESSINGS) ×2 IMPLANT
STRIP CLOSURE SKIN 1/4X4 (GAUZE/BANDAGES/DRESSINGS) ×2 IMPLANT
SUT ETHILON 4-0 (SUTURE)
SUT ETHILON 4-0 FS2 18XMFL BLK (SUTURE)
SUT ETHILON 5-0 FS-2 18 BLK (SUTURE) ×2 IMPLANT
SUT VIC AB 1 CT1 36 (SUTURE) IMPLANT
SUT VIC AB 2-0 CT1 27 (SUTURE)
SUT VIC AB 2-0 CT1 TAPERPNT 27 (SUTURE) IMPLANT
SUT VIC AB 2-0 SH 27 (SUTURE)
SUT VIC AB 2-0 SH 27XBRD (SUTURE) IMPLANT
SUT VIC AB 3-0 SH 27 (SUTURE)
SUT VIC AB 3-0 SH 27X BRD (SUTURE) IMPLANT
SUT VIC AB 4-0 FS2 27 (SUTURE) ×2 IMPLANT
SUT VICRYL AB 3-0 FS1 BRD 27IN (SUTURE) IMPLANT
SUTURE ETHLN 4-0 FS2 18XMF BLK (SUTURE) IMPLANT
SYRINGE 10CC LL (SYRINGE) IMPLANT
WATER STERILE IRR 1000ML POUR (IV SOLUTION) ×2 IMPLANT

## 2015-01-26 NOTE — Anesthesia Postprocedure Evaluation (Signed)
  Anesthesia Post-op Note  Patient: Jennifer Gardner  Procedure(s) Performed: Procedure(s) with comments: HAMMER TOE CORRECTION (Left) - LMA WITH LOCAL WEIL OSTEOTOMY 2ND (Left) - IVA WITH LOCAL  Anesthesia type:General  Patient location: PACU  Post pain: Pain level controlled  Post assessment: Post-op Vital signs reviewed, Patient's Cardiovascular Status Stable, Respiratory Function Stable, Patent Airway and No signs of Nausea or vomiting  Post vital signs: Reviewed and stable  Last Vitals:  Filed Vitals:   01/26/15 0915  BP: 109/62  Pulse: 76  Temp:   Resp: 18    Level of consciousness: awake, alert  and patient cooperative  Complications: No apparent anesthesia complications

## 2015-01-26 NOTE — Anesthesia Preprocedure Evaluation (Signed)
Anesthesia Evaluation  Patient identified by MRN, date of birth, ID band Patient awake    Reviewed: Allergy & Precautions, NPO status , Patient's Chart, lab work & pertinent test results  Airway Mallampati: II  TM Distance: >3 FB Neck ROM: Full    Dental   Pulmonary    Pulmonary exam normal       Cardiovascular hypertension, Normal cardiovascular exam    Neuro/Psych PSYCHIATRIC DISORDERS Depression    GI/Hepatic   Endo/Other    Renal/GU      Musculoskeletal  (+) Arthritis -,   Abdominal   Peds  Hematology   Anesthesia Other Findings   Reproductive/Obstetrics                             Anesthesia Physical Anesthesia Plan  ASA: II  Anesthesia Plan: General   Post-op Pain Management:    Induction: Intravenous  Airway Management Planned: LMA  Additional Equipment:   Intra-op Plan:   Post-operative Plan: Extubation in OR  Informed Consent: I have reviewed the patients History and Physical, chart, labs and discussed the procedure including the risks, benefits and alternatives for the proposed anesthesia with the patient or authorized representative who has indicated his/her understanding and acceptance.     Plan Discussed with: CRNA  Anesthesia Plan Comments:         Anesthesia Quick Evaluation

## 2015-01-26 NOTE — H&P (Signed)
History and Physical reviewed and no changes from previous exam.

## 2015-01-26 NOTE — Transfer of Care (Signed)
Immediate Anesthesia Transfer of Care Note  Patient: Jennifer Gardner  Procedure(s) Performed: Procedure(s) with comments: HAMMER TOE CORRECTION (Left) - LMA WITH LOCAL WEIL OSTEOTOMY 2ND (Left) - IVA WITH LOCAL  Patient Location: PACU  Anesthesia Type: General  Level of Consciousness: awake, alert  and patient cooperative  Airway and Oxygen Therapy: Patient Spontanous Breathing and Patient connected to supplemental oxygen  Post-op Assessment: Post-op Vital signs reviewed, Patient's Cardiovascular Status Stable, Respiratory Function Stable, Patent Airway and No signs of Nausea or vomiting  Post-op Vital Signs: Reviewed and stable  Complications: No apparent anesthesia complications

## 2015-01-26 NOTE — Op Note (Signed)
Operative  note   Surgeon: Dr. Albertine Patricia, DPM.    Assistant:none      Preop diagnosis:plantar displaced second metatarsal. Hammertoe deformity second toe left foot    Postop diagnosisame    Procedure:  2nd metatarsal osteotomy with screw fixatiion:   Hammertoe correction 2nd toe left     EBL:5cc    Anesthesia:general    Hemostasis:ankle tourniquet 251mHg    Specimen:none    Complications:none    Operative indications:chronic [pain    Procedure:  Patient was brought into the OR and placed on the operating table in thesupine position. After anesthesia was obtained theleft lower extremity was prepped and draped in usual sterile fashion.  Operative Report: At this time attention was directed to the dorsum of the second metatarsal phalangeal joint where a 3 cm linear incision was made over the metatarsal distal shaft and head and extended into 2 semielliptical incisions over the PIP joint of the second toe left foot. Incision was deepened sharp blunt dissection bleeders clamped and bovied as required. The extensor tendon was identified and released along the medial aspect of the extensor hood. The tendon was then retracted laterally exposing the capsular periosteal tissue to the second metatarsal distal shaft head and metatarsophalangeal joint. Incision made linear through this and dissected mediolaterally. An osteotomy was then performed in the second metatarsal starting at the osteochondral junction going from dorsal distal medial plantar and oblique fashion. A second level was removed and our to allow for some dorsiflexion of the bone at same time that it was shortened to maintain a neutral sagittal plane. At this time there is temporally fixated with 2 K wires checked FluoroScan good position correction were noted. 22.2 screws from the met Artis screw set were then placed across the osteotomy holding it stable. There is checked FluoroScan good position correction and metatarsal  parabola were noted. There is an copiously irrigated. At this time to his directed to the second toe where the extensor tendon over the PIP joint was incised transversely reflected proximally and distally. The articular cartilage off the proximal phalanx head and the middle phalanx base was then resected and a K wire was run through the middle distal phalanges and retrograded in the proximal phalanx is checked FluoroScan good position correction were noted. The toes and held in a corrected position plantar lateral position and then the K wire was delivered across the metatarsophalangeal joint through the metatarsal head. This seemed to hold the toe in a rectus position. After copious irrigation the tendon over the PIP joint was and sutured with 4 Vicryl simple interrupted sutures. Periosteal Tissues and closed over the metatarsal with a 4-0 Vicryl continuous stitch. This was continued to close deep and superficial fascial layers. Skin was enclosed with 4-0 Vicryl subcuticular stitch with 1 5-0 nylon at the distal portion of the incision margin. This time a local block was used 0.5% Marcaine plain and a sterile compressive dressing was placed across wound consisting of Steri-Strips Xeroform gauze 4 x 4's Kling and Kerlix    Patient tolerated the procedure and anesthesia well.  Was transported from the OR to the PACU with all vital signs stable and vascular status intact. To be discharged per routine protocol.  Will follow up in approximately 1 week in the outpatient clinic.

## 2015-01-26 NOTE — Anesthesia Procedure Notes (Signed)
Procedure Name: LMA Insertion Date/Time: 01/26/2015 7:43 AM Performed by: Londell Moh Pre-anesthesia Checklist: Patient identified, Emergency Drugs available, Suction available, Timeout performed and Patient being monitored Patient Re-evaluated:Patient Re-evaluated prior to inductionOxygen Delivery Method: Circle system utilized Preoxygenation: Pre-oxygenation with 100% oxygen Intubation Type: IV induction LMA: LMA inserted LMA Size: 4.0 Number of attempts: 1 Placement Confirmation: positive ETCO2 and breath sounds checked- equal and bilateral Tube secured with: Tape

## 2015-01-27 ENCOUNTER — Encounter: Payer: Self-pay | Admitting: Podiatry

## 2015-02-01 ENCOUNTER — Other Ambulatory Visit: Payer: Self-pay | Admitting: Internal Medicine

## 2015-02-07 ENCOUNTER — Encounter: Payer: Self-pay | Admitting: Internal Medicine

## 2015-02-07 ENCOUNTER — Ambulatory Visit (INDEPENDENT_AMBULATORY_CARE_PROVIDER_SITE_OTHER): Payer: BLUE CROSS/BLUE SHIELD | Admitting: Internal Medicine

## 2015-02-07 ENCOUNTER — Ambulatory Visit: Payer: BLUE CROSS/BLUE SHIELD | Admitting: Internal Medicine

## 2015-02-07 VITALS — BP 108/71 | HR 83 | Temp 98.2°F | Ht 62.5 in | Wt 167.0 lb

## 2015-02-07 DIAGNOSIS — F329 Major depressive disorder, single episode, unspecified: Secondary | ICD-10-CM

## 2015-02-07 DIAGNOSIS — I10 Essential (primary) hypertension: Secondary | ICD-10-CM

## 2015-02-07 DIAGNOSIS — E785 Hyperlipidemia, unspecified: Secondary | ICD-10-CM

## 2015-02-07 DIAGNOSIS — F32A Depression, unspecified: Secondary | ICD-10-CM

## 2015-02-07 LAB — COMPREHENSIVE METABOLIC PANEL
ALT: 28 U/L (ref 0–35)
AST: 27 U/L (ref 0–37)
Albumin: 4.5 g/dL (ref 3.5–5.2)
Alkaline Phosphatase: 50 U/L (ref 39–117)
BUN: 16 mg/dL (ref 6–23)
CHLORIDE: 101 meq/L (ref 96–112)
CO2: 28 mEq/L (ref 19–32)
CREATININE: 0.73 mg/dL (ref 0.40–1.20)
Calcium: 9.7 mg/dL (ref 8.4–10.5)
GFR: 86.84 mL/min (ref >60.00)
Glucose, Bld: 114 mg/dL — ABNORMAL HIGH (ref 70–99)
POTASSIUM: 3.5 meq/L (ref 3.5–5.1)
Sodium: 138 mEq/L (ref 135–145)
Total Bilirubin: 0.5 mg/dL (ref 0.2–1.2)
Total Protein: 7.1 g/dL (ref 6.0–8.3)

## 2015-02-07 NOTE — Patient Instructions (Signed)
Labs today.  Follow up in 6 months and sooner as needed. 

## 2015-02-07 NOTE — Assessment & Plan Note (Signed)
Symptoms well controlled. Continue Cymbalta. Follow up in 6 months and prn.

## 2015-02-07 NOTE — Assessment & Plan Note (Signed)
Will check LFTs with labs today. Continue Crestor.

## 2015-02-07 NOTE — Progress Notes (Addendum)
Subjective:    Patient ID: Jennifer Gardner, female    DOB: 05/29/1956, 59 y.o.   MRN: [id number]  HPI  59YO female presents for 6 month follow up.  S/p left 2nd metatarsal surgery. Pain severe at times. Using occasional hydrocodone with minimal improvement.  Compliant with medication. No recent CP, palpitations. No new concerns today.  Symptoms of depression have been well controlled on Cymbalta. No side effects noted.   Past Medical History  Diagnosis Date  . Hyperlipidemia   . Depression     after death of father 10-14-2004  . Shingles     back - none recently  . Hypertension     pt denies.  maxide for fluid retention  . H/O benign essential tremor   . Arthritis     toes, fingers  . Wears contact lenses    Family History  Problem Relation Age of Onset  . Cancer Mother     prostate  . Heart disease Father     CHF  . Parkinsonism Maternal Uncle    Past Surgical History  Procedure Laterality Date  . Breast surgery      breast reduction  . Foot surgery Bilateral 06/2013    Dr. Elvina Mattes  . Facial cosmetic surgery  06/2014  . Cholecystectomy  10-14-2008    [facility]  . Hammer toe surgery Left 01/26/2015    Procedure: HAMMER TOE CORRECTION;  Surgeon: Albertine Patricia, DPM;  Location: [facility];  Service: Podiatry;  Laterality: Left;  LMA WITH LOCAL  . Weil osteotomy Left 01/26/2015    Procedure: WEIL OSTEOTOMY 2ND;  Surgeon: Albertine Patricia, DPM;  Location: [facility];  Service: Podiatry;  Laterality: Left;  IVA WITH LOCAL   Social History   Social History  . Marital Status: Married    Spouse Name: N/A  . Number of Children: N/A  . Years of Education: N/A   Occupational History  . Retired     [employer]   Social History Main Topics  . Smoking status: Never Smoker   . Smokeless tobacco: Never Used  . Alcohol Use: 0.6 oz/week    1 Glasses of wine per week     Comment: occassionally  . Drug Use: No  . Sexual Activity: Not Asked   Other Topics Concern  . None    Social History Narrative   Lives in [place] with husband. Dog in home.      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      Diet - regular      Exercise - 5 days per week 42min    Review of Systems  Constitutional: Negative for fever, chills, appetite change, fatigue and unexpected weight change.  Eyes: Negative for visual disturbance.  Respiratory: Negative for shortness of breath.   Cardiovascular: Negative for chest pain and leg swelling.  Gastrointestinal: Negative for nausea, vomiting, abdominal pain, diarrhea and constipation.  Musculoskeletal: Positive for arthralgias and gait problem. Negative for myalgias.  Skin: Negative for color change and rash.  Hematological: Negative for adenopathy. Does not bruise/bleed easily.  Psychiatric/Behavioral: Negative for dysphoric mood. The patient is not nervous/anxious.        Objective:    BP 108/71 mmHg  Pulse 83  Temp(Src) 98.2 F (36.8 C) (Oral)  Ht 5' 2.5" (1.588 m)  Wt 167 lb (75.751 kg)  BMI 30.04 kg/m2  SpO2 95% Physical Exam  Constitutional: She is oriented to person, place, and time. She appears well-developed  and well-nourished. No distress.  HENT:  Head: Normocephalic and atraumatic.  Right Ear: External ear normal.  Left Ear: External ear normal.  Nose: Nose normal.  Mouth/Throat: Oropharynx is clear and moist. No oropharyngeal exudate.  Eyes: Conjunctivae are normal. Pupils are equal, round, and reactive to light. Right eye exhibits no discharge. Left eye exhibits no discharge. No scleral icterus.  Neck: Normal range of motion. Neck supple. No tracheal deviation present. No thyromegaly present.  Cardiovascular: Normal rate, regular rhythm, normal heart sounds and intact distal pulses.  Exam reveals no gallop and no friction rub.   No murmur heard. Pulmonary/Chest: Effort normal and breath sounds normal. No respiratory distress. She has no wheezes. She has no rales.  She exhibits no tenderness.  Musculoskeletal: She exhibits no edema or tenderness.       Left ankle: She exhibits decreased range of motion.  Left foot in boot  Lymphadenopathy:    She has no cervical adenopathy.  Neurological: She is alert and oriented to person, place, and time. No cranial nerve deficit. She exhibits normal muscle tone. Coordination normal.  Skin: Skin is warm and dry. No rash noted. She is not diaphoretic. No erythema. No pallor.  Psychiatric: She has a normal mood and affect. Her behavior is normal. Judgment and thought content normal.          Assessment & Plan:   Problem List Items Addressed This Visit      Unprioritized   Depression    Symptoms well controlled. Continue Cymbalta. Follow up in 6 months and prn.      Hyperlipidemia    Will check LFTs with labs today. Continue Crestor.      Hypertension - Primary    BP Readings from Last 3 Encounters:  02/07/15 108/71  01/26/15 109/62  08/12/14 101/63   BP well controlled. Renal function with labs today.      Relevant Orders   Comprehensive metabolic panel       Return in about 6 months (around 08/10/2015) for Physical.

## 2015-02-07 NOTE — Progress Notes (Signed)
Pre visit review using our clinic review tool, if applicable. No additional management support is needed unless otherwise documented below in the visit note. 

## 2015-02-07 NOTE — Assessment & Plan Note (Signed)
BP Readings from Last 3 Encounters:  02/07/15 108/71  01/26/15 109/62  08/12/14 101/63   BP well controlled. Renal function with labs today.

## 2015-02-09 ENCOUNTER — Ambulatory Visit: Payer: BLUE CROSS/BLUE SHIELD | Admitting: Internal Medicine

## 2015-03-17 ENCOUNTER — Other Ambulatory Visit: Payer: Self-pay | Admitting: Internal Medicine

## 2015-04-09 ENCOUNTER — Other Ambulatory Visit: Payer: Self-pay | Admitting: Internal Medicine

## 2015-04-20 ENCOUNTER — Other Ambulatory Visit: Payer: Self-pay | Admitting: Neurology

## 2015-04-20 MED ORDER — TOPIRAMATE 100 MG PO TABS: 100.0000 mg | ORAL_TABLET | Freq: Two times a day (BID) | ORAL | Status: DC

## 2015-04-20 NOTE — Telephone Encounter (Signed)
Topamax refill requested. Per last office note- patient to remain on medication. Refill approved and sent to patient's pharmacy.   

## 2015-06-09 ENCOUNTER — Other Ambulatory Visit: Payer: Self-pay | Admitting: Internal Medicine

## 2015-06-09 NOTE — Telephone Encounter (Signed)
Please advise? i looked and reviewed her chart. i was unable to see why this medication was needed. Wanted to make sure you were okay with refilling.

## 2015-08-10 ENCOUNTER — Encounter: Payer: Self-pay | Admitting: Internal Medicine

## 2015-08-10 ENCOUNTER — Ambulatory Visit (INDEPENDENT_AMBULATORY_CARE_PROVIDER_SITE_OTHER): Payer: BLUE CROSS/BLUE SHIELD | Admitting: Internal Medicine

## 2015-08-10 VITALS — BP 104/72 | HR 83 | Temp 98.8°F | Ht 61.5 in | Wt 162.1 lb

## 2015-08-10 DIAGNOSIS — Z0000 Encounter for general adult medical examination without abnormal findings: Secondary | ICD-10-CM

## 2015-08-10 LAB — COMPREHENSIVE METABOLIC PANEL
ALT: 34 U/L (ref 0–35)
AST: 29 U/L (ref 0–37)
Albumin: 4.9 g/dL (ref 3.5–5.2)
Alkaline Phosphatase: 49 U/L (ref 39–117)
BUN: 19 mg/dL (ref 6–23)
CHLORIDE: 101 meq/L (ref 96–112)
CO2: 27 meq/L (ref 19–32)
CREATININE: 0.65 mg/dL (ref 0.40–1.20)
Calcium: 9.4 mg/dL (ref 8.4–10.5)
GFR: 99.12 mL/min (ref >60.00)
GLUCOSE: 106 mg/dL — AB (ref 70–99)
Potassium: 3.4 mEq/L — ABNORMAL LOW (ref 3.5–5.1)
Sodium: 139 mEq/L (ref 135–145)
Total Bilirubin: 0.5 mg/dL (ref 0.2–1.2)
Total Protein: 7.2 g/dL (ref 6.0–8.3)

## 2015-08-10 LAB — CBC WITH DIFFERENTIAL/PLATELET
BASOS ABS: 0 10*3/uL (ref 0.0–0.1)
BASOS PCT: 0.7 % (ref 0.0–3.0)
Eosinophils Absolute: 0.1 10*3/uL (ref 0.0–0.7)
Eosinophils Relative: 2.2 % (ref 0.0–5.0)
HCT: 44.1 % (ref 36.0–46.0)
Hemoglobin: 14.8 g/dL (ref 12.0–15.0)
LYMPHS ABS: 1.3 10*3/uL (ref 0.7–4.0)
Lymphocytes Relative: 31.2 % (ref 12.0–46.0)
MCHC: 33.6 g/dL (ref 30.0–36.0)
MCV: 83.4 fl (ref 78.0–100.0)
MONOS PCT: 7.7 % (ref 3.0–12.0)
Monocytes Absolute: 0.3 10*3/uL (ref 0.1–1.0)
NEUTROS ABS: 2.5 10*3/uL (ref 1.4–7.7)
NEUTROS PCT: 58.2 % (ref 43.0–77.0)
PLATELETS: 264 10*3/uL (ref 150.0–400.0)
RBC: 5.28 Mil/uL — ABNORMAL HIGH (ref 3.87–5.11)
RDW: 14.5 % (ref 11.5–15.5)
WBC: 4.3 10*3/uL (ref 4.0–10.5)

## 2015-08-10 LAB — LIPID PANEL
CHOL/HDL RATIO: 4
Cholesterol: 181 mg/dL (ref 0–200)
HDL: 46.4 mg/dL (ref >39.00)
LDL Cholesterol: 109 mg/dL — ABNORMAL HIGH (ref 0–99)
NonHDL: 134.87
Triglycerides: 129 mg/dL (ref 0.0–149.0)
VLDL: 25.8 mg/dL (ref 0.0–40.0)

## 2015-08-10 LAB — MICROALBUMIN / CREATININE URINE RATIO
CREATININE, U: 124.9 mg/dL
Microalb Creat Ratio: 0.8 mg/g (ref 0.0–30.0)
Microalb, Ur: 1 mg/dL (ref 0.0–1.9)

## 2015-08-10 LAB — VITAMIN D 25 HYDROXY (VIT D DEFICIENCY, FRACTURES): VITD: 47.92 ng/mL (ref 30.00–100.00)

## 2015-08-10 LAB — TSH: TSH: 1.51 u[IU]/mL (ref 0.35–4.50)

## 2015-08-10 NOTE — Assessment & Plan Note (Signed)
General medical exam normal today including breast exam. PAP and pelvic deferred as normal PAP 2016, HPV neg. Mammogram ordered. Labs today. Encouraged healthy diet and exercise.

## 2015-08-10 NOTE — Progress Notes (Signed)
Subjective:    Patient ID: Jennifer Gardner, female    DOB: June 16, 1956, 60 y.o.   MRN: [id number]  HPI  60YO female presents for physical exam.   Feeling well. No concerns today.   Wt Readings from Last 3 Encounters:  08/10/15 162 lb 2 oz (73.539 kg)  02/07/15 167 lb (75.751 kg)  01/26/15 165 lb (74.844 kg)   BP Readings from Last 3 Encounters:  08/10/15 104/72  02/07/15 108/71  01/26/15 109/62    Past Medical History  Diagnosis Date  . Hyperlipidemia   . Depression     after death of father October 04, 2004  . Shingles     back - none recently  . Hypertension     pt denies.  maxide for fluid retention  . H/O benign essential tremor   . Arthritis     toes, fingers  . Wears contact lenses    Family History  Problem Relation Age of Onset  . Cancer Mother     prostate  . Heart disease Father     CHF  . Parkinsonism Maternal Uncle    Past Surgical History  Procedure Laterality Date  . Breast surgery      breast reduction  . Foot surgery Bilateral 06/2013    Dr. Elvina Mattes  . Facial cosmetic surgery  06/2014  . Cholecystectomy  2008-10-04    [facility]  . Hammer toe surgery Left 01/26/2015    Procedure: HAMMER TOE CORRECTION;  Surgeon: Albertine Patricia, DPM;  Location: [facility];  Service: Podiatry;  Laterality: Left;  LMA WITH LOCAL  . Weil osteotomy Left 01/26/2015    Procedure: WEIL OSTEOTOMY 2ND;  Surgeon: Albertine Patricia, DPM;  Location: [facility];  Service: Podiatry;  Laterality: Left;  IVA WITH LOCAL   Social History   Social History  . Marital Status: Married    Spouse Name: N/A  . Number of Children: N/A  . Years of Education: N/A   Occupational History  . Retired     [employer]   Social History Main Topics  . Smoking status: Never Smoker   . Smokeless tobacco: Never Used  . Alcohol Use: 0.6 oz/week    1 Glasses of wine per week     Comment: occassionally  . Drug Use: No  . Sexual Activity: Not Asked   Other Topics Concern  . None   Social  History Narrative   Lives in [place] with husband. Dog in home.      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      Diet - regular      Exercise - 5 days per week 57min    Review of Systems  Constitutional: Negative for fever, chills, appetite change, fatigue and unexpected weight change.  Eyes: Negative for visual disturbance.  Respiratory: Negative for shortness of breath.   Cardiovascular: Negative for chest pain and leg swelling.  Gastrointestinal: Negative for nausea, vomiting, abdominal pain, diarrhea, constipation and blood in stool.  Musculoskeletal: Negative for myalgias and arthralgias.  Skin: Negative for color change and rash.  Hematological: Negative for adenopathy. Does not bruise/bleed easily.  Psychiatric/Behavioral: Negative for suicidal ideas, sleep disturbance and dysphoric mood. The patient is not nervous/anxious.        Objective:    BP 104/72 mmHg  Pulse 83  Temp(Src) 98.8 F (37.1 C) (Oral)  Ht 5' 1.5" (1.562 m)  Wt 162 lb 2 oz (73.539 kg)  BMI 30.14 kg/m2  SpO2 95%  Physical Exam  Constitutional: She is oriented to person, place, and time. She appears well-developed and well-nourished. No distress.  HENT:  Head: Normocephalic and atraumatic.  Right Ear: External ear normal.  Left Ear: External ear normal.  Nose: Nose normal.  Mouth/Throat: Oropharynx is clear and moist. No oropharyngeal exudate.  Eyes: Conjunctivae are normal. Pupils are equal, round, and reactive to light. Right eye exhibits no discharge. Left eye exhibits no discharge. No scleral icterus.  Neck: Normal range of motion. Neck supple. No tracheal deviation present. No thyromegaly present.  Cardiovascular: Normal rate, regular rhythm, normal heart sounds and intact distal pulses.  Exam reveals no gallop and no friction rub.   No murmur heard. Pulmonary/Chest: Effort normal and breath sounds normal. No accessory muscle usage. No tachypnea.  No respiratory distress. She has no decreased breath sounds. She has no wheezes. She has no rhonchi. She has no rales. She exhibits no tenderness. Right breast exhibits no inverted nipple, no mass, no nipple discharge, no skin change and no tenderness. Left breast exhibits no inverted nipple, no mass, no nipple discharge, no skin change and no tenderness. Breasts are symmetrical.    Abdominal: Soft. Bowel sounds are normal. She exhibits no distension and no mass. There is no tenderness. There is no rebound and no guarding.  Musculoskeletal: Normal range of motion. She exhibits no edema or tenderness.  Lymphadenopathy:    She has no cervical adenopathy.  Neurological: She is alert and oriented to person, place, and time. No cranial nerve deficit. She exhibits normal muscle tone. Coordination normal.  Skin: Skin is warm and dry. No rash noted. She is not diaphoretic. No erythema. No pallor.  Psychiatric: She has a normal mood and affect. Her behavior is normal. Judgment and thought content normal.          Assessment & Plan:   Problem List Items Addressed This Visit      Unprioritized   Routine general medical examination at a health care facility - Primary    General medical exam normal today including breast exam. PAP and pelvic deferred as normal PAP 2016, HPV neg. Mammogram ordered. Labs today. Encouraged healthy diet and exercise.      Relevant Orders   MM Digital Screening   Comprehensive metabolic panel   CBC with Differential/Platelet   Lipid panel   TSH   VITAMIN D 25 Hydroxy (Vit-D Deficiency, Fractures)   Microalbumin / creatinine urine ratio       Return in about 6 months (around 02/07/2016) for Recheck.  Ronette Deter, MD Internal Medicine [facility]

## 2015-08-10 NOTE — Addendum Note (Signed)
Addended by: Leeanne Rio on: 08/10/2015 08:54 AM   Modules accepted: Miquel Dunn

## 2015-08-10 NOTE — Progress Notes (Signed)
Pre visit review using our clinic review tool, if applicable. No additional management support is needed unless otherwise documented below in the visit note. 

## 2015-08-10 NOTE — Patient Instructions (Signed)
Health Maintenance, Female Adopting a healthy lifestyle and getting preventive care can go a long way to promote health and wellness. Talk with your health care provider about what schedule of regular examinations is right for you. This is a good chance for you to check in with your provider about disease prevention and staying healthy. In between checkups, there are plenty of things you can do on your own. Experts have done a lot of research about which lifestyle changes and preventive measures are most likely to keep you healthy. Ask your health care provider for more information. WEIGHT AND DIET  Eat a healthy diet  Be sure to include plenty of vegetables, fruits, low-fat dairy products, and lean protein.  Do not eat a lot of foods high in solid fats, added sugars, or salt.  Get regular exercise. This is one of the most important things you can do for your health.  Most adults should exercise for at least 150 minutes each week. The exercise should increase your heart rate and make you sweat (moderate-intensity exercise).  Most adults should also do strengthening exercises at least twice a week. This is in addition to the moderate-intensity exercise.  Maintain a healthy weight  Body mass index (BMI) is a measurement that can be used to identify possible weight problems. It estimates body fat based on height and weight. Your health care provider can help determine your BMI and help you achieve or maintain a healthy weight.  For females 20 years of age and older:   A BMI below 18.5 is considered underweight.  A BMI of 18.5 to 24.9 is normal.  A BMI of 25 to 29.9 is considered overweight.  A BMI of 30 and above is considered obese.  Watch levels of cholesterol and blood lipids  You should start having your blood tested for lipids and cholesterol at 59 years of age, then have this test every 5 years.  You may need to have your cholesterol levels checked more often if:  Your lipid  or cholesterol levels are high.  You are older than 59 years of age.  You are at high risk for heart disease.  CANCER SCREENING   Lung Cancer  Lung cancer screening is recommended for adults 55-80 years old who are at high risk for lung cancer because of a history of smoking.  A yearly low-dose CT scan of the lungs is recommended for people who:  Currently smoke.  Have quit within the past 15 years.  Have at least a 30-pack-year history of smoking. A pack year is smoking an average of one pack of cigarettes a day for 1 year.  Yearly screening should continue until it has been 15 years since you quit.  Yearly screening should stop if you develop a health problem that would prevent you from having lung cancer treatment.  Breast Cancer  Practice breast self-awareness. This means understanding how your breasts normally appear and feel.  It also means doing regular breast self-exams. Let your health care provider know about any changes, no matter how small.  If you are in your 20s or 30s, you should have a clinical breast exam (CBE) by a health care provider every 1-3 years as part of a regular health exam.  If you are 40 or older, have a CBE every year. Also consider having a breast X-ray (mammogram) every year.  If you have a family history of breast cancer, talk to your health care provider about genetic screening.  If you   are at high risk for breast cancer, talk to your health care provider about having an MRI and a mammogram every year.  Breast cancer gene (BRCA) assessment is recommended for women who have family members with BRCA-related cancers. BRCA-related cancers include:  Breast.  Ovarian.  Tubal.  Peritoneal cancers.  Results of the assessment will determine the need for genetic counseling and BRCA1 and BRCA2 testing. Cervical Cancer Your health care provider may recommend that you be screened regularly for cancer of the pelvic organs (ovaries, uterus, and  vagina). This screening involves a pelvic examination, including checking for microscopic changes to the surface of your cervix (Pap test). You may be encouraged to have this screening done every 3 years, beginning at age 21.  For women ages 30-65, health care providers may recommend pelvic exams and Pap testing every 3 years, or they may recommend the Pap and pelvic exam, combined with testing for human papilloma virus (HPV), every 5 years. Some types of HPV increase your risk of cervical cancer. Testing for HPV may also be done on women of any age with unclear Pap test results.  Other health care providers may not recommend any screening for nonpregnant women who are considered low risk for pelvic cancer and who do not have symptoms. Ask your health care provider if a screening pelvic exam is right for you.  If you have had past treatment for cervical cancer or a condition that could lead to cancer, you need Pap tests and screening for cancer for at least 20 years after your treatment. If Pap tests have been discontinued, your risk factors (such as having a new sexual partner) need to be reassessed to determine if screening should resume. Some women have medical problems that increase the chance of getting cervical cancer. In these cases, your health care provider may recommend more frequent screening and Pap tests. Colorectal Cancer  This type of cancer can be detected and often prevented.  Routine colorectal cancer screening usually begins at 59 years of age and continues through 59 years of age.  Your health care provider may recommend screening at an earlier age if you have risk factors for colon cancer.  Your health care provider may also recommend using home test kits to check for hidden blood in the stool.  A small camera at the end of a tube can be used to examine your colon directly (sigmoidoscopy or colonoscopy). This is done to check for the earliest forms of colorectal  cancer.  Routine screening usually begins at age 50.  Direct examination of the colon should be repeated every 5-10 years through 59 years of age. However, you may need to be screened more often if early forms of precancerous polyps or small growths are found. Skin Cancer  Check your skin from head to toe regularly.  Tell your health care provider about any new moles or changes in moles, especially if there is a change in a mole's shape or color.  Also tell your health care provider if you have a mole that is larger than the size of a pencil eraser.  Always use sunscreen. Apply sunscreen liberally and repeatedly throughout the day.  Protect yourself by wearing long sleeves, pants, a wide-brimmed hat, and sunglasses whenever you are outside. HEART DISEASE, DIABETES, AND HIGH BLOOD PRESSURE   High blood pressure causes heart disease and increases the risk of stroke. High blood pressure is more likely to develop in:  People who have blood pressure in the high end   of the normal range (130-139/85-89 mm Hg).  People who are overweight or obese.  People who are African American.  If you are 38-23 years of age, have your blood pressure checked every 3-5 years. If you are 61 years of age or older, have your blood pressure checked every year. You should have your blood pressure measured twice--once when you are at a hospital or clinic, and once when you are not at a hospital or clinic. Record the average of the two measurements. To check your blood pressure when you are not at a hospital or clinic, you can use:  An automated blood pressure machine at a pharmacy.  A home blood pressure monitor.  If you are between 45 years and 39 years old, ask your health care provider if you should take aspirin to prevent strokes.  Have regular diabetes screenings. This involves taking a blood sample to check your fasting blood sugar level.  If you are at a normal weight and have a low risk for diabetes,  have this test once every three years after 59 years of age.  If you are overweight and have a high risk for diabetes, consider being tested at a younger age or more often. PREVENTING INFECTION  Hepatitis B  If you have a higher risk for hepatitis B, you should be screened for this virus. You are considered at high risk for hepatitis B if:  You were born in a country where hepatitis B is common. Ask your health care provider which countries are considered high risk.  Your parents were born in a high-risk country, and you have not been immunized against hepatitis B (hepatitis B vaccine).  You have HIV or AIDS.  You use needles to inject street drugs.  You live with someone who has hepatitis B.  You have had sex with someone who has hepatitis B.  You get hemodialysis treatment.  You take certain medicines for conditions, including cancer, organ transplantation, and autoimmune conditions. Hepatitis C  Blood testing is recommended for:  Everyone born from [birth year] through 1964.  Anyone with known risk factors for hepatitis C. Sexually transmitted infections (STIs)  You should be screened for sexually transmitted infections (STIs) including gonorrhea and chlamydia if:  You are sexually active and are younger than 59 years of age.  You are older than 59 years of age and your health care provider tells you that you are at risk for this type of infection.  Your sexual activity has changed since you were last screened and you are at an increased risk for chlamydia or gonorrhea. Ask your health care provider if you are at risk.  If you do not have HIV, but are at risk, it may be recommended that you take a prescription medicine daily to prevent HIV infection. This is called pre-exposure prophylaxis (PrEP). You are considered at risk if:  You are sexually active and do not regularly use condoms or know the HIV status of your partner(s).  You take drugs by injection.  You are sexually  active with a partner who has HIV. Talk with your health care provider about whether you are at high risk of being infected with HIV. If you choose to begin PrEP, you should first be tested for HIV. You should then be tested every 3 months for as long as you are taking PrEP.  PREGNANCY   If you are premenopausal and you may become pregnant, ask your health care provider about preconception counseling.  If you may  become pregnant, take 400 to 800 micrograms (mcg) of folic acid every day.  If you want to prevent pregnancy, talk to your health care provider about birth control (contraception). OSTEOPOROSIS AND MENOPAUSE   Osteoporosis is a disease in which the bones lose minerals and strength with aging. This can result in serious bone fractures. Your risk for osteoporosis can be identified using a bone density scan.  If you are 61 years of age or older, or if you are at risk for osteoporosis and fractures, ask your health care provider if you should be screened.  Ask your health care provider whether you should take a calcium or vitamin D supplement to lower your risk for osteoporosis.  Menopause may have certain physical symptoms and risks.  Hormone replacement therapy may reduce some of these symptoms and risks. Talk to your health care provider about whether hormone replacement therapy is right for you.  HOME CARE INSTRUCTIONS   Schedule regular health, dental, and eye exams.  Stay current with your immunizations.   Do not use any tobacco products including cigarettes, chewing tobacco, or electronic cigarettes.  If you are pregnant, do not drink alcohol.  If you are breastfeeding, limit how much and how often you drink alcohol.  Limit alcohol intake to no more than 1 drink per day for nonpregnant women. One drink equals 12 ounces of beer, 5 ounces of wine, or 1 ounces of hard liquor.  Do not use street drugs.  Do not share needles.  Ask your health care provider for help if  you need support or information about quitting drugs.  Tell your health care provider if you often feel depressed.  Tell your health care provider if you have ever been abused or do not feel safe at home.   This information is not intended to replace advice given to you by your health care provider. Make sure you discuss any questions you have with your health care provider.   Document Released: 12/17/2010 Document Revised: 06/24/2014 Document Reviewed: 05/05/2013 Elsevier Interactive Patient Education [employer].

## 2015-08-18 ENCOUNTER — Ambulatory Visit
Admission: RE | Admit: 2015-08-18 | Discharge: 2015-08-18 | Disposition: A | Discharge status: Home / Self Care | Payer: BLUE CROSS/BLUE SHIELD | Source: Ambulatory Visit | Attending: Internal Medicine | Admitting: Internal Medicine

## 2015-08-18 DIAGNOSIS — Z1231 Encounter for screening mammogram for malignant neoplasm of breast: Secondary | ICD-10-CM | POA: Diagnosis not present

## 2015-08-18 DIAGNOSIS — Z0000 Encounter for general adult medical examination without abnormal findings: Secondary | ICD-10-CM

## 2015-10-01 ENCOUNTER — Other Ambulatory Visit: Payer: Self-pay | Admitting: Internal Medicine

## 2015-10-08 ENCOUNTER — Other Ambulatory Visit: Payer: Self-pay | Admitting: Internal Medicine

## 2015-11-30 ENCOUNTER — Other Ambulatory Visit: Payer: Self-pay | Admitting: Internal Medicine

## 2015-12-28 ENCOUNTER — Other Ambulatory Visit: Payer: Self-pay | Admitting: Internal Medicine

## 2016-01-17 ENCOUNTER — Other Ambulatory Visit: Payer: Self-pay | Admitting: Internal Medicine

## 2016-02-06 ENCOUNTER — Ambulatory Visit: Payer: BLUE CROSS/BLUE SHIELD | Admitting: Internal Medicine

## 2016-02-12 ENCOUNTER — Other Ambulatory Visit: Payer: Self-pay

## 2016-02-12 ENCOUNTER — Ambulatory Visit: Payer: BLUE CROSS/BLUE SHIELD | Admitting: Internal Medicine

## 2016-02-12 MED ORDER — PROGESTERONE MICRONIZED 100 MG PO CAPS: 100.0000 mg | ORAL_CAPSULE | Freq: Every day | ORAL | 3 refills | Status: DC

## 2016-05-24 ENCOUNTER — Other Ambulatory Visit: Payer: Self-pay

## 2016-05-24 MED ORDER — ACYCLOVIR 400 MG PO TABS: 400.0000 mg | ORAL_TABLET | Freq: Two times a day (BID) | ORAL | 1 refills | Status: AC

## 2016-05-24 NOTE — Telephone Encounter (Signed)
Last office visit 08/09/10 to follow up in 6 months around 02/07/16 no follow up scheduled.  Please advise.

## 2016-05-27 ENCOUNTER — Other Ambulatory Visit: Payer: Self-pay | Admitting: Family Medicine

## 2016-05-27 NOTE — Telephone Encounter (Signed)
Refilled 02/12/16. Pt last seen by Dr.Walker 08/10/15. Please advise? No future appt made. Please advise?

## 2016-06-24 ENCOUNTER — Ambulatory Visit: Payer: BLUE CROSS/BLUE SHIELD | Admitting: Neurology

## 2016-06-26 NOTE — Progress Notes (Signed)
Jennifer Gardner was seen today in the movement disorders clinic for neurologic f/u.  Her PCP is MCLAUGHLIN, MIRIAM K, MD.  She is f/u for tremor.  She previously saw [name] in [place].  I do not have those records.  The patient reports that she has had tremor for 1 1/2 to 2 years.  She first noted it in her L hand but she occasionally notices it in her R.  She has some quivering in the mouth as well.   She was started on primidone and over the years has worked up to her current 250 mg tid.  She went back to [name] b/c she felt that the primidone was not working as well as it previously had.  When she went back, he thought that she had transitioned from ET to PD.  It was recommended that she try levodopa.  She did this, but did not find it helpful.  She believes that she was on it twice per day and was on it for 90 days.  It was subsequently recommended that she have a DAT scan performed in December 2013.  This turned out to be quite expensive and she was not sure that she wanted to do it, so it has not been done.  The patient states that tremor is worse in the AM.  It is worse with caffeine.  She drinks 1/2 caffeine coffee and she drinks 2 mugs of that in the AM.  She does drink unsweet tea but notices no difference with that.  She does note that a BC powder will make it worse.  Alcohol may improve tremor.  She has a maternal uncle with PD and her uncles son has tremor but it is not PD.  She notices the tremor worse when she is moving the arms.  She notices the mouth tremor with eating and her friends have pointed it out to her.  She is able to eat soup or peas without trouble.  Stress may increase the tremor.  Last visit, we weaned her off the cymbalta.  She initially had increasing depression with this and we slowed down the wean and increased the prozac.  She still doesn't feel as good as she did on the cymbalta but does not want to go back to it yet.  She has the same amount of tremor.   03/05/13  update:  Last visit, propranolol was added.  However, this caused her blood pressure to drop.  The patient did call because we had changed her Cymbalta to Prozac as she thought that this causes tremor.  However, once she got off of the Cymbalta, the tremor was no better, depression was worse and she ultimately went back to the Cymbalta.  Today, she states that she would like to go off of the primidone.  She thinks that it contributes or causes her Dupuytren's contractures.  She was on a very large dosage of primidone at 250 mg 3 times per day and she self weaned it and is now on 250 mg once per day.  She noted no change in the tremor.  The tremor is most bothersome in the AM.  Today she notes that while she knew her maternal uncle had PD, his sons (her cousin) dx was recently changed to PD.  06/25/13 update:  The pt has tremor.  Since last visit she has had a DaT scan that was normal.  She had weaned herself off of primidone and tremor got worse but not significantly  so.  She does know that she is not as groggy as previously.  She is on cymbalta and doing well in that regard.  09/27/13 update:  Last patient, the pt was started on topamax for ET.  She is on 100 mg daily.  She doesn't think that it was helpful. She is on it one time per day.  She had some taste aversion to soda and some paresthesias when she initially started the medication, but both of those are now better.  She did have foot surgery since our last visit.  She has recovered well from that.  12/29/13 update:  Last visit, I increased the patient's Topamax for her essential tremor treatment with 100 mg twice per day.  The patient reports that she is doing better.  She denies significant side effects with the medication.  Friends tell her that they note a significant difference in mouth tremor and that is something that she doesn't even notice.    07/14/14 update:  The patient returns today for follow-up.  She reports that she is doing very well.   Reports that tremor is virtually gone.  She denies any side effects with the Topamax.  She did have a face lift about 2 weeks ago.  06/28/15 update:  Pt returns for follow up.  On topamax 100 mg; taking it once per day.  Thinks that tremor has increased.  Doing well.  No falls or cognitive change.  No paresthesias.    Neuroimaging has not previously been performed.    PREVIOUS MEDICATIONS: Sinemet and primidone  ALLERGIES:   Allergies  Allergen Reactions  . Meloxicam     swelling  . Sulfa Antibiotics Hives  . Augmentin [Amoxicillin-Pot Clavulanate] Rash  . Penicillins Hives and Rash    CURRENT MEDICATIONS:  Current Outpatient Prescriptions on File Prior to Visit  Medication Sig Dispense Refill  . acyclovir (ZOVIRAX) 400 MG tablet Take 1 tablet (400 mg total) by mouth 2 (two) times daily. 180 tablet 1  . B Complex-C (SUPER B COMPLEX PO) Take by mouth.    . Calcium Carb-Cholecalciferol (CALCIUM + D3 PO) Take by mouth.    . Cholecalciferol (VITAMIN D PO) Take by mouth.    . DULoxetine (CYMBALTA) 30 MG capsule take 1 capsule by mouth once daily 90 capsule 3  . estradiol (VIVELLE-DOT) 0.05 MG/24HR patch apply 1 patch TWICE A WEEK 16 patch 6  . progesterone (PROMETRIUM) 100 MG capsule take 1 capsule by mouth daily 30 capsule 3  . rosuvastatin (CRESTOR) 20 MG tablet take 1 tablet by mouth once daily 90 tablet 0  . topiramate (TOPAMAX) 100 MG tablet Take 1 tablet (100 mg total) by mouth 2 (two) times daily. 60 tablet 5  . triamterene-hydrochlorothiazide (MAXZIDE-25) 37.5-25 MG tablet take 1 tablet by mouth once daily 90 tablet 0   No current facility-administered medications on file prior to visit.     PAST MEDICAL HISTORY:   Past Medical History:  Diagnosis Date  . Arthritis    toes, fingers  . Depression    after death of father 10/08/04  . H/O benign essential tremor   . Hyperlipidemia   . Hypertension    pt denies.  maxide for fluid retention  . Shingles    back - none recently   . Wears contact lenses     PAST SURGICAL HISTORY:   Past Surgical History:  Procedure Laterality Date  . BREAST SURGERY     breast reduction  . CHOLECYSTECTOMY  2008/10/08  [facility]  . FACIAL COSMETIC SURGERY  06/2014  . FOOT SURGERY Bilateral 06/2013   Dr. Elvina Mattes  . HAMMER TOE SURGERY Left 01/26/2015   Procedure: HAMMER TOE CORRECTION;  Surgeon: Albertine Patricia, DPM;  Location: [facility];  Service: Podiatry;  Laterality: Left;  LMA WITH LOCAL  . REDUCTION MAMMAPLASTY Bilateral 2008  . WEIL OSTEOTOMY Left 01/26/2015   Procedure: WEIL OSTEOTOMY 2ND;  Surgeon: Albertine Patricia, DPM;  Location: [facility];  Service: Podiatry;  Laterality: Left;  IVA WITH LOCAL    SOCIAL HISTORY:   Social History   Social History  . Marital status: Married    Spouse name: N/A  . Number of children: N/A  . Years of education: N/A   Occupational History  . Retired     [employer]   Social History Main Topics  . Smoking status: Never Smoker  . Smokeless tobacco: Never Used  . Alcohol use 0.6 oz/week    1 Glasses of wine per week     Comment: occassionally  . Drug use: No  . Sexual activity: Not on file   Other Topics Concern  . Not on file   Social History Narrative   Lives in [place] with husband. Dog in home.      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      Diet - regular      Exercise - 5 days per week 53min    FAMILY HISTORY:   Family Status  Relation Status  . Mother Alive   healthy  . Father Deceased   CHF  . Maternal Uncle Deceased   PD  . Sister Alive   alive    ROS:  A complete 10 system review of systems was obtained and was unremarkable apart from what is mentioned above.  PHYSICAL EXAMINATION:    VITALS:   Vitals:   06/27/16 1152  BP: 124/76  Pulse: 83  SpO2: 97%  Weight: 165 lb 4 oz (75 kg)  Height: 5' 1.5" (1.562 m)   Wt Readings from Last 3 Encounters:  06/27/16 165 lb 4 oz (75 kg)  08/10/15  162 lb 2 oz (73.5 kg)  02/07/15 167 lb (75.8 kg)    GEN:  The patient appears stated age and is in NAD. HEENT:  Normocephalic, atraumatic.  The mucous membranes are moist. The superficial temporal arteries are without ropiness or tenderness. CV:  RRR Lungs:  CTAB Neck/HEME:  There are no carotid bruits bilaterally.  There is a prominent left cervical anterior lymph node  Neurological examination:  Orientation: The patient is alert and oriented x3. Fund of knowledge is appropriate.  Recent and remote memory are intact.  Attention and concentration are normal.    Able to name objects and repeat phrases. Cranial nerves: There is good facial symmetry.The speech is fluent and clear. Soft palate rises symmetrically and there is no tongue deviation. Hearing is intact to conversational tone. Sensation: Sensation is intact to light touch throughout. Motor: Strength is 5/5 in the bilateral upper and lower extremities.     Movement examination: Tone: There is normal tone in the bilateral upper extremities.  The tone in the lower extremities is normal.  Abnormal movements: There is no tremor today in the hands.  There is rare chin tremor.  She pours a very full glass of water from one glass to another without any difficulty. Coordination:  There is no trouble with rapid alternating movements today. Gait and Station: The  patient has no difficulty arising out of a deep-seated chair without the use of the hands. The patient's stride length is normal.      ASSESSMENT/PLAN:  1.  essential tremor.  -  DaT scan was done and was negative.  I reassured her again today that she does not have Parkinson's disease.  -She will remain on Topamax.  I wanted to increase to 150 mg daily but she wants to increase to 100 mg bid as having more tremor    Risks, benefits, side effects and alternative therapies were discussed.  The opportunity to ask questions was given and they were answered to the best of my ability.   The patient expressed understanding and willingness to follow the outlined treatment protocols. 2.   Depression.  -She is doing well back on the cymbalta 3.  I will plan on seeing the patient back in the next year, but encouraged her to call me should any questions or concerns arise before that time.

## 2016-06-27 ENCOUNTER — Ambulatory Visit (INDEPENDENT_AMBULATORY_CARE_PROVIDER_SITE_OTHER): Payer: Managed Care, Other (non HMO) | Admitting: Neurology

## 2016-06-27 ENCOUNTER — Encounter: Payer: Self-pay | Admitting: Neurology

## 2016-06-27 VITALS — BP 124/76 | HR 83 | Ht 61.5 in | Wt 165.2 lb

## 2016-06-27 DIAGNOSIS — G250 Essential tremor: Secondary | ICD-10-CM | POA: Diagnosis not present

## 2016-06-27 MED ORDER — TOPIRAMATE 100 MG PO TABS: 100.0000 mg | ORAL_TABLET | Freq: Two times a day (BID) | ORAL | 6 refills | Status: DC

## 2016-06-27 MED ORDER — TOPIRAMATE 25 MG PO TABS: 25.0000 mg | ORAL_TABLET | ORAL | 0 refills | Status: DC

## 2016-06-27 NOTE — Patient Instructions (Addendum)
1.  Continue topamax 100 mg at night 2.  Start topamax in the AM as follows:  1 tablet in the AM for 1 week, then 2 tablets in AM for 1 week and then 3 tablets in AM for 1 week. 3.  At week 4, you will be taking 100 mg of topamax twice per day. 4.  Follow up in 6-8 months but call me if you should need to see me sooner or if the med changes caused problems or didn't work

## 2016-07-01 ENCOUNTER — Telehealth: Payer: Self-pay | Admitting: Neurology

## 2016-07-01 NOTE — Telephone Encounter (Signed)
-----   Message from Sherlynn Carbon sent at 07/01/2016 10:14 AM EST ----- [facility] called and needs clarification on an electronic prescription/Da[facility]  CB# [phone]

## 2016-07-01 NOTE — Telephone Encounter (Signed)
Spoke with [employer] [facility] and gave them directions for Topamax 25 mg - Start topamax in the AM as follows:  1 tablet in the AM for 1 week, then 2 tablets in AM for 1 week and then 3 tablets in AM for 1 week.

## 2016-10-23 ENCOUNTER — Other Ambulatory Visit: Payer: Self-pay | Admitting: Family Medicine

## 2016-10-23 NOTE — Telephone Encounter (Deleted)
Refilled: 05/27/16 Last OV: 08/10/15 Last Labs: 08/10/15 Future OV: none Please advise?

## 2016-11-04 ENCOUNTER — Other Ambulatory Visit: Payer: Self-pay | Admitting: Family Medicine

## 2016-11-04 NOTE — Telephone Encounter (Signed)
Please advise refill for Progesterone 100 mg Last refilled 05/27/2016 Last OV: 08/10/2015 No new appt

## 2016-11-20 ENCOUNTER — Other Ambulatory Visit: Payer: Self-pay | Admitting: Family Medicine

## 2017-01-26 ENCOUNTER — Other Ambulatory Visit: Payer: Self-pay | Admitting: Neurology

## 2017-01-28 ENCOUNTER — Ambulatory Visit: Payer: Managed Care, Other (non HMO) | Admitting: Neurology

## 2017-02-03 ENCOUNTER — Other Ambulatory Visit: Payer: Self-pay | Admitting: Family Medicine

## 2017-02-03 NOTE — Telephone Encounter (Signed)
This refill request came to our office, not a patient of ours, please advise, thank

## 2017-02-21 ENCOUNTER — Other Ambulatory Visit: Payer: Self-pay | Admitting: Physician Assistant

## 2017-02-21 DIAGNOSIS — Z1231 Encounter for screening mammogram for malignant neoplasm of breast: Secondary | ICD-10-CM

## 2017-03-05 ENCOUNTER — Ambulatory Visit
Admission: RE | Admit: 2017-03-05 | Discharge: 2017-03-05 | Disposition: A | Discharge status: Home / Self Care | Payer: Managed Care, Other (non HMO) | Source: Ambulatory Visit | Attending: Physician Assistant | Admitting: Physician Assistant

## 2017-03-05 DIAGNOSIS — Z1231 Encounter for screening mammogram for malignant neoplasm of breast: Secondary | ICD-10-CM | POA: Diagnosis present

## 2017-04-15 NOTE — Progress Notes (Signed)
Jennifer Gardner was seen today in the movement disorders clinic for neurologic f/u.  Her PCP is Jennifer Elk, MD.  She is f/u for tremor.  She previously saw [name] in [place].  I do not have those records.  The patient reports that she has had tremor for 1 1/2 to 2 years.  She first noted it in her L hand but she occasionally notices it in her R.  She has some quivering in the mouth as well.   She was started on primidone and over the years has worked up to her current 250 mg tid.  She went back to [name] b/c she felt that the primidone was not working as well as it previously had.  When she went back, he thought that she had transitioned from ET to PD.  It was recommended that she try levodopa.  She did this, but did not find it helpful.  She believes that she was on it twice per day and was on it for 90 days.  It was subsequently recommended that she have a DAT scan performed in December 2013.  This turned out to be quite expensive and she was not sure that she wanted to do it, so it has not been done.  The patient states that tremor is worse in the AM.  It is worse with caffeine.  She drinks 1/2 caffeine coffee and she drinks 2 mugs of that in the AM.  She does drink unsweet tea but notices no difference with that.  She does note that a BC powder will make it worse.  Alcohol may improve tremor.  She has a maternal uncle with PD and her uncles son has tremor but it is not PD.  She notices the tremor worse when she is moving the arms.  She notices the mouth tremor with eating and her friends have pointed it out to her.  She is able to eat soup or peas without trouble.  Stress may increase the tremor.  Last visit, we weaned her off the cymbalta.  She initially had increasing depression with this and we slowed down the wean and increased the prozac.  She still doesn't feel as good as she did on the cymbalta but does not want to go back to it yet.  She has the same amount of tremor.   03/05/13  update:  Last visit, propranolol was added.  However, this caused her blood pressure to drop.  The patient did call because we had changed her Cymbalta to Prozac as she thought that this causes tremor.  However, once she got off of the Cymbalta, the tremor was no better, depression was worse and she ultimately went back to the Cymbalta.  Today, she states that she would like to go off of the primidone.  She thinks that it contributes or causes her Dupuytren's contractures.  She was on a very large dosage of primidone at 250 mg 3 times per day and she self weaned it and is now on 250 mg once per day.  She noted no change in the tremor.  The tremor is most bothersome in the AM.  Today she notes that while she knew her maternal uncle had PD, his sons (her cousin) dx was recently changed to PD.  06/25/13 update:  The pt has tremor.  Since last visit she has had a DaT scan that was normal.  She had weaned herself off of primidone and tremor got worse but not significantly  so.  She does know that she is not as groggy as previously.  She is on cymbalta and doing well in that regard.  09/27/13 update:  Last patient, the pt was started on topamax for ET.  She is on 100 mg daily.  She doesn't think that it was helpful. She is on it one time per day.  She had some taste aversion to soda and some paresthesias when she initially started the medication, but both of those are now better.  She did have foot surgery since our last visit.  She has recovered well from that.  12/29/13 update:  Last visit, I increased the patient's Topamax for her essential tremor treatment with 100 mg twice per day.  The patient reports that she is doing better.  She denies significant side effects with the medication.  Friends tell her that they note a significant difference in mouth tremor and that is something that she doesn't even notice.    07/14/14 update:  The patient returns today for follow-up.  She reports that she is doing very well.   Reports that tremor is virtually gone.  She denies any side effects with the Topamax.  She did have a face lift about 2 weeks ago.  06/28/15 update:  Pt returns for follow up.  On topamax 100 mg; taking it once per day.  Thinks that tremor has increased.  Doing well.  No falls or cognitive change.  No paresthesias.    04/17/17 update: Patient seen in follow-up for tremor.  Her topiramate was increased to 100 mg twice per day last visit.  She reports that she is doing about the same.  She didn't think that the increase really helped but the topamax does help mouth tremor.  She doesn't want to back down on the dosage now.  She does have hand tremor that comes and goes.  She denies any side effects with that medication.  Reviewed records from her primary care provider.  Last was seen there in March.  Doing well with Cymbalta for mood.  Neuroimaging has not previously been performed.    PREVIOUS MEDICATIONS: Sinemet and primidone  ALLERGIES:   Allergies  Allergen Reactions  . Meloxicam     swelling  . Sulfa Antibiotics Hives  . Augmentin [Amoxicillin-Pot Clavulanate] Rash  . Penicillins Hives and Rash    CURRENT MEDICATIONS:  Current Outpatient Prescriptions on File Prior to Visit  Medication Sig Dispense Refill  . acyclovir (ZOVIRAX) 400 MG tablet Take 1 tablet (400 mg total) by mouth 2 (two) times daily. 180 tablet 1  . B Complex-C (SUPER B COMPLEX PO) Take by mouth.    . Calcium Carb-Cholecalciferol (CALCIUM + D3 PO) Take by mouth.    . Cholecalciferol (VITAMIN D PO) Take by mouth.    . DULoxetine (CYMBALTA) 30 MG capsule take 1 capsule by mouth once daily 90 capsule 3  . estradiol (VIVELLE-DOT) 0.05 MG/24HR patch apply 1 patch TWICE A WEEK 16 patch 6  . progesterone (PROMETRIUM) 100 MG capsule take 1 capsule by mouth daily 30 capsule 3  . rosuvastatin (CRESTOR) 20 MG tablet take 1 tablet by mouth once daily 90 tablet 0  . topiramate (TOPAMAX) 100 MG tablet take 1 tablet by mouth twice a  day 60 tablet 2  . triamterene-hydrochlorothiazide (MAXZIDE-25) 37.5-25 MG tablet take 1 tablet by mouth once daily 90 tablet 0   No current facility-administered medications on file prior to visit.     PAST MEDICAL HISTORY:  Past Medical History:  Diagnosis Date  . Arthritis    toes, fingers  . Depression    after death of father Sep 25, 2004  . H/O benign essential tremor   . Hyperlipidemia   . Hypertension    pt denies.  maxide for fluid retention  . Shingles    back - none recently  . Wears contact lenses     PAST SURGICAL HISTORY:   Past Surgical History:  Procedure Laterality Date  . BREAST SURGERY     breast reduction  . CHOLECYSTECTOMY  September 25, 2008   [facility]  . FACIAL COSMETIC SURGERY  06/2014  . FOOT SURGERY Bilateral 06/2013   Dr. Elvina Mattes  . HAMMER TOE SURGERY Left 01/26/2015   Procedure: HAMMER TOE CORRECTION;  Surgeon: Albertine Patricia, DPM;  Location: [facility];  Service: Podiatry;  Laterality: Left;  LMA WITH LOCAL  . REDUCTION MAMMAPLASTY Bilateral 09/26/2006  . WEIL OSTEOTOMY Left 01/26/2015   Procedure: WEIL OSTEOTOMY 2ND;  Surgeon: Albertine Patricia, DPM;  Location: [facility];  Service: Podiatry;  Laterality: Left;  IVA WITH LOCAL    SOCIAL HISTORY:   Social History   Social History  . Marital status: Married    Spouse name: N/A  . Number of children: N/A  . Years of education: N/A   Occupational History  . Retired     [employer]   Social History Main Topics  . Smoking status: Never Smoker  . Smokeless tobacco: Never Used  . Alcohol use 0.6 oz/week    1 Glasses of wine per week     Comment: occassionally  . Drug use: No  . Sexual activity: Not on file   Other Topics Concern  . Not on file   Social History Narrative   Lives in [place] with husband. Dog in home.      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      Diet - regular      Exercise - 5 days per week 63min    FAMILY HISTORY:   Family  Status  Relation Status  . Mother Alive       healthy  . Father Deceased       CHF  . Mat Uncle Deceased       PD  . Sister Alive       alive  . Neg Hx (Not Specified)    ROS:  A complete 10 system review of systems was obtained and was unremarkable apart from what is mentioned above.  PHYSICAL EXAMINATION:    VITALS:   Vitals:   04/17/17 0819  BP: 98/62  Pulse: 88  SpO2: 98%  Weight: 167 lb (75.8 kg)  Height: 5\' 2"  (1.575 m)   Wt Readings from Last 3 Encounters:  04/17/17 167 lb (75.8 kg)  06/27/16 165 lb 4 oz (75 kg)  08/10/15 162 lb 2 oz (73.5 kg)    GEN:  The patient appears stated age and is in NAD. HEENT:  Normocephalic, atraumatic.  The mucous membranes are moist. The superficial temporal arteries are without ropiness or tenderness. CV:  RRR Lungs:  CTAB Neck/HEME:  There are no carotid bruits bilaterally.  There is a prominent left cervical anterior lymph node  Neurological examination:  Orientation: The patient is alert and oriented x3.  Cranial nerves: There is good facial symmetry.The speech is fluent and clear. Soft palate rises symmetrically and there is no tongue deviation. Hearing is intact to conversational tone. Sensation: Sensation is  intact to light touch throughout. Motor: Strength is 5/5 in the bilateral upper and lower extremities.     Movement examination: Tone: There is normal tone in the bilateral upper extremities.  The tone in the lower extremities is normal.  Abnormal movements: There is mild postural tremor of the L hand.  she has no difficulty with archimedes spirals. Coordination:  There is no trouble with rapid alternating movements today. Gait and Station: The patient has no difficulty arising out of a deep-seated chair without the use of the hands. The patient's stride length is normal.      ASSESSMENT/PLAN:  1.  essential tremor.  - DaT scan was done and was negative.  I reassured her again today that she does not have  Parkinson's disease.  -She will remain on Topamax.  She wants to stay on 100 mg bid.  Risks, benefits, side effects and alternative therapies were discussed.  The opportunity to ask questions was given and they were answered to the best of my ability.  The patient expressed understanding and willingness to follow the outlined treatment protocols.  -talked about surgery but she isn't interested and I don't recommend right now 2.   Depression.  -She is doing well back on the cymbalta 3.  I will plan on seeing the patient back prn.

## 2017-04-17 ENCOUNTER — Ambulatory Visit (INDEPENDENT_AMBULATORY_CARE_PROVIDER_SITE_OTHER): Payer: 59 | Admitting: Neurology

## 2017-04-17 ENCOUNTER — Encounter: Payer: Self-pay | Admitting: Neurology

## 2017-04-17 VITALS — BP 98/62 | HR 88 | Ht 62.0 in | Wt 167.0 lb

## 2017-04-17 DIAGNOSIS — G250 Essential tremor: Secondary | ICD-10-CM

## 2017-04-17 MED ORDER — TOPIRAMATE 100 MG PO TABS: 100.0000 mg | ORAL_TABLET | Freq: Two times a day (BID) | ORAL | 1 refills | Status: AC

## 2017-06-17 HISTORY — PX: COLONOSCOPY: SHX174

## 2018-02-12 DIAGNOSIS — D485 Neoplasm of uncertain behavior of skin: Secondary | ICD-10-CM | POA: Diagnosis not present

## 2018-02-12 DIAGNOSIS — L739 Follicular disorder, unspecified: Secondary | ICD-10-CM | POA: Diagnosis not present

## 2018-02-12 DIAGNOSIS — Z1283 Encounter for screening for malignant neoplasm of skin: Secondary | ICD-10-CM | POA: Diagnosis not present

## 2018-02-12 DIAGNOSIS — L820 Inflamed seborrheic keratosis: Secondary | ICD-10-CM | POA: Diagnosis not present

## 2018-04-17 ENCOUNTER — Other Ambulatory Visit: Payer: Self-pay | Admitting: Physician Assistant

## 2018-04-17 DIAGNOSIS — Z1231 Encounter for screening mammogram for malignant neoplasm of breast: Secondary | ICD-10-CM

## 2018-05-05 DIAGNOSIS — M19071 Primary osteoarthritis, right ankle and foot: Secondary | ICD-10-CM | POA: Diagnosis not present

## 2018-05-05 DIAGNOSIS — M79671 Pain in right foot: Secondary | ICD-10-CM | POA: Diagnosis not present

## 2018-05-05 DIAGNOSIS — M2031 Hallux varus (acquired), right foot: Secondary | ICD-10-CM | POA: Diagnosis not present

## 2018-05-07 DIAGNOSIS — E782 Mixed hyperlipidemia: Secondary | ICD-10-CM | POA: Diagnosis not present

## 2018-05-07 DIAGNOSIS — R7303 Prediabetes: Secondary | ICD-10-CM | POA: Diagnosis not present

## 2018-05-07 DIAGNOSIS — G250 Essential tremor: Secondary | ICD-10-CM | POA: Diagnosis not present

## 2018-05-07 DIAGNOSIS — I10 Essential (primary) hypertension: Secondary | ICD-10-CM | POA: Diagnosis not present

## 2018-05-07 DIAGNOSIS — Z0000 Encounter for general adult medical examination without abnormal findings: Secondary | ICD-10-CM | POA: Diagnosis not present

## 2018-05-07 DIAGNOSIS — Z124 Encounter for screening for malignant neoplasm of cervix: Secondary | ICD-10-CM | POA: Diagnosis not present

## 2018-05-19 ENCOUNTER — Ambulatory Visit
Admission: RE | Admit: 2018-05-19 | Discharge: 2018-05-19 | Disposition: A | Discharge status: Home / Self Care | Payer: 59 | Source: Ambulatory Visit | Attending: Physician Assistant | Admitting: Physician Assistant

## 2018-05-19 DIAGNOSIS — Z1231 Encounter for screening mammogram for malignant neoplasm of breast: Secondary | ICD-10-CM | POA: Insufficient documentation

## 2018-06-01 DIAGNOSIS — R300 Dysuria: Secondary | ICD-10-CM | POA: Diagnosis not present

## 2018-06-30 ENCOUNTER — Other Ambulatory Visit: Payer: Self-pay | Admitting: Podiatry

## 2018-07-02 ENCOUNTER — Encounter: Payer: Self-pay | Admitting: *Deleted

## 2018-07-02 ENCOUNTER — Other Ambulatory Visit: Payer: Self-pay

## 2018-07-08 NOTE — Anesthesia Preprocedure Evaluation (Addendum)
Anesthesia Evaluation  Patient identified by MRN, date of birth, ID band Patient awake    Reviewed: Allergy & Precautions, NPO status , Patient's Chart, lab work & pertinent test results  History of Anesthesia Complications Negative for: history of anesthetic complications  Airway Mallampati: II   Neck ROM: Full    Dental  (+)    Pulmonary neg pulmonary ROS,    Pulmonary exam normal breath sounds clear to auscultation       Cardiovascular Exercise Tolerance: Good hypertension, Normal cardiovascular exam Rhythm:Regular Rate:Normal     Neuro/Psych PSYCHIATRIC DISORDERS Depression Essential tremor    GI/Hepatic negative GI ROS,   Endo/Other  diabetes (pre-diabetes)  Renal/GU negative Renal ROS     Musculoskeletal  (+) Arthritis ,   Abdominal   Peds  Hematology negative hematology ROS (+)   Anesthesia Other Findings   Reproductive/Obstetrics                            Anesthesia Physical Anesthesia Plan  ASA: II  Anesthesia Plan: General   Post-op Pain Management:    Induction: Intravenous  PONV Risk Score and Plan: 3 and Dexamethasone and Ondansetron  Airway Management Planned: LMA  Additional Equipment:   Intra-op Plan:   Post-operative Plan: Extubation in OR  Informed Consent: I have reviewed the patients History and Physical, chart, labs and discussed the procedure including the risks, benefits and alternatives for the proposed anesthesia with the patient or authorized representative who has indicated his/her understanding and acceptance.       Plan Discussed with: CRNA  Anesthesia Plan Comments:        Anesthesia Quick Evaluation

## 2018-07-09 ENCOUNTER — Ambulatory Visit
Admission: RE | Admit: 2018-07-09 | Discharge: 2018-07-09 | Disposition: A | Discharge status: Home / Self Care | Payer: BLUE CROSS/BLUE SHIELD | Attending: Podiatry | Admitting: Podiatry

## 2018-07-09 ENCOUNTER — Ambulatory Visit: Payer: BLUE CROSS/BLUE SHIELD | Admitting: Anesthesiology

## 2018-07-09 ENCOUNTER — Encounter
Admission: RE | Disposition: A | Discharge status: Home / Self Care | Payer: Self-pay | Source: Home / Self Care | Attending: Podiatry

## 2018-07-09 DIAGNOSIS — Z79890 Hormone replacement therapy (postmenopausal): Secondary | ICD-10-CM | POA: Diagnosis not present

## 2018-07-09 DIAGNOSIS — F418 Other specified anxiety disorders: Secondary | ICD-10-CM | POA: Diagnosis not present

## 2018-07-09 DIAGNOSIS — M2031 Hallux varus (acquired), right foot: Secondary | ICD-10-CM | POA: Insufficient documentation

## 2018-07-09 DIAGNOSIS — Z79899 Other long term (current) drug therapy: Secondary | ICD-10-CM | POA: Insufficient documentation

## 2018-07-09 DIAGNOSIS — I10 Essential (primary) hypertension: Secondary | ICD-10-CM | POA: Diagnosis not present

## 2018-07-09 DIAGNOSIS — M19071 Primary osteoarthritis, right ankle and foot: Secondary | ICD-10-CM | POA: Diagnosis not present

## 2018-07-09 DIAGNOSIS — A6000 Herpesviral infection of urogenital system, unspecified: Secondary | ICD-10-CM | POA: Diagnosis not present

## 2018-07-09 DIAGNOSIS — E785 Hyperlipidemia, unspecified: Secondary | ICD-10-CM | POA: Diagnosis not present

## 2018-07-09 HISTORY — PX: ARTHRODESIS METATARSALPHALANGEAL JOINT (MTPJ): SHX6566

## 2018-07-09 HISTORY — DX: Adverse effect of unspecified anesthetic, initial encounter: T41.45XA

## 2018-07-09 HISTORY — DX: Other complications of anesthesia, initial encounter: T88.59XA

## 2018-07-09 SURGERY — FUSION, JOINT, GREAT TOE
Anesthesia: General | Site: Foot | Laterality: Right

## 2018-07-09 MED ORDER — MIDAZOLAM HCL 5 MG/5ML IJ SOLN
INTRAMUSCULAR | Status: DC | PRN
  Administered 2018-07-09: 2 mg via INTRAVENOUS

## 2018-07-09 MED ORDER — ONDANSETRON HCL 4 MG/2ML IJ SOLN: 4.0000 mg | Freq: Once | INTRAMUSCULAR | Status: DC | PRN

## 2018-07-09 MED ORDER — LACTATED RINGERS IV SOLN
INTRAVENOUS | Status: DC
  Administered 2018-07-09: 09:00:00 via INTRAVENOUS

## 2018-07-09 MED ORDER — LACTATED RINGERS IV SOLN: INTRAVENOUS | Status: DC

## 2018-07-09 MED ORDER — PROPOFOL 10 MG/ML IV BOLUS
INTRAVENOUS | Status: DC | PRN
  Administered 2018-07-09: 150 mg via INTRAVENOUS

## 2018-07-09 MED ORDER — ACETAMINOPHEN 10 MG/ML IV SOLN
1000.0000 mg | Freq: Once | INTRAVENOUS | Status: DC | PRN
  Administered 2018-07-09: 1000 mg via INTRAVENOUS

## 2018-07-09 MED ORDER — LIDOCAINE HCL (CARDIAC) PF 100 MG/5ML IV SOSY
PREFILLED_SYRINGE | INTRAVENOUS | Status: DC | PRN
  Administered 2018-07-09: 40 mg via INTRATRACHEAL

## 2018-07-09 MED ORDER — CLINDAMYCIN PHOSPHATE 900 MG/50ML IV SOLN
900.0000 mg | INTRAVENOUS | Status: AC
  Administered 2018-07-09: 900 mg via INTRAVENOUS

## 2018-07-09 MED ORDER — BUPIVACAINE LIPOSOME 1.3 % IJ SUSP
INTRAMUSCULAR | Status: DC | PRN
  Administered 2018-07-09: 3 mL
  Administered 2018-07-09: 4 mL

## 2018-07-09 MED ORDER — POVIDONE-IODINE 7.5 % EX SOLN: Freq: Once | CUTANEOUS | Status: DC

## 2018-07-09 MED ORDER — HYDROCODONE-ACETAMINOPHEN 7.5-325 MG PO TABS: 1.0000 | ORAL_TABLET | Freq: Four times a day (QID) | ORAL | 0 refills | Status: DC | PRN

## 2018-07-09 MED ORDER — BUPIVACAINE HCL 0.25 % IJ SOLN
INTRAMUSCULAR | Status: DC | PRN
  Administered 2018-07-09: 4 mL

## 2018-07-09 MED ORDER — OXYCODONE HCL 5 MG PO TABS
5.0000 mg | ORAL_TABLET | Freq: Once | ORAL | Status: AC | PRN
  Administered 2018-07-09: 5 mg via ORAL

## 2018-07-09 MED ORDER — ONDANSETRON HCL 4 MG/2ML IJ SOLN
INTRAMUSCULAR | Status: DC | PRN
  Administered 2018-07-09: 4 mg via INTRAVENOUS

## 2018-07-09 MED ORDER — FENTANYL CITRATE (PF) 100 MCG/2ML IJ SOLN
25.0000 ug | INTRAMUSCULAR | Status: DC | PRN
  Administered 2018-07-09 (×2): 25 ug via INTRAVENOUS

## 2018-07-09 MED ORDER — DEXAMETHASONE SODIUM PHOSPHATE 4 MG/ML IJ SOLN
INTRAMUSCULAR | Status: DC | PRN
  Administered 2018-07-09: 4 mg via INTRAVENOUS

## 2018-07-09 MED ORDER — FENTANYL CITRATE (PF) 100 MCG/2ML IJ SOLN
INTRAMUSCULAR | Status: DC | PRN
  Administered 2018-07-09 (×2): 25 ug via INTRAVENOUS
  Administered 2018-07-09: 50 ug via INTRAVENOUS
  Administered 2018-07-09: 25 ug via INTRAVENOUS

## 2018-07-09 MED ORDER — OXYCODONE HCL 5 MG/5ML PO SOLN: 5.0000 mg | Freq: Once | ORAL | Status: AC | PRN

## 2018-07-09 SURGICAL SUPPLY — 62 items
APL SKNCLS STERI-STRIP NONHPOA (GAUZE/BANDAGES/DRESSINGS) ×1
BANDAGE ELASTIC 4 VELCRO NS (GAUZE/BANDAGES/DRESSINGS) ×2 IMPLANT
BENZOIN TINCTURE PRP APPL 2/3 (GAUZE/BANDAGES/DRESSINGS) ×2 IMPLANT
BIT DRILL 2 FENESTRATED (MISCELLANEOUS) IMPLANT
BIT DRILL CANNULTD 2.6 X 130MM (DRILL) IMPLANT
BIT DRILLL 2 FENESTRATED (MISCELLANEOUS) ×1
BLADE MINI RND TIP GREEN BEAV (BLADE) IMPLANT
BLADE OSC/SAGITTAL MD 5.5X18 (BLADE) IMPLANT
BLADE OSC/SAGITTAL MD 9X18.5 (BLADE) ×1 IMPLANT
BNDG ESMARK 4X12 TAN STRL LF (GAUZE/BANDAGES/DRESSINGS) ×2 IMPLANT
BNDG GAUZE 4.5X4.1 6PLY STRL (MISCELLANEOUS) ×2 IMPLANT
BNDG STRETCH 4X75 STRL LF (GAUZE/BANDAGES/DRESSINGS) ×2 IMPLANT
CANISTER SUCT 1200ML W/VALVE (MISCELLANEOUS) ×2 IMPLANT
CAST PADDING 3X4FT ST 30246 (SOFTGOODS) ×2
COUNTERSICK 4.0 HEADED (MISCELLANEOUS) ×2
COVER LIGHT HANDLE UNIVERSAL (MISCELLANEOUS) ×4 IMPLANT
CUFF TOURN SGL QUICK 18 (TOURNIQUET CUFF) ×1 IMPLANT
DRAPE FLUOR MINI C-ARM 54X84 (DRAPES) ×2 IMPLANT
DRILL CANNULATED 2.6 X 130MM (DRILL) ×2
DURAPREP 26ML APPLICATOR (WOUND CARE) ×2 IMPLANT
ELECT REM PT RETURN 9FT ADLT (ELECTROSURGICAL) ×2
ELECTRODE REM PT RTRN 9FT ADLT (ELECTROSURGICAL) ×1 IMPLANT
GAUZE PETRO XEROFOAM 1X8 (MISCELLANEOUS) ×2 IMPLANT
GAUZE SPONGE 4X4 12PLY STRL (GAUZE/BANDAGES/DRESSINGS) ×2 IMPLANT
GLOVE BIO SURGEON STRL SZ8 (GLOVE) ×4 IMPLANT
GOWN STRL REUS W/ TWL LRG LVL3 (GOWN DISPOSABLE) ×1 IMPLANT
GOWN STRL REUS W/ TWL XL LVL3 (GOWN DISPOSABLE) ×1 IMPLANT
GOWN STRL REUS W/TWL LRG LVL3 (GOWN DISPOSABLE) ×2
GOWN STRL REUS W/TWL XL LVL3 (GOWN DISPOSABLE) ×2
K-WIRE DBL END TROCAR 6X.045 (WIRE)
K-WIRE DBL END TROCAR 6X.062 (WIRE)
K-WIRE SNGL END 1.2X150 (MISCELLANEOUS) ×2
KIT TURNOVER KIT A (KITS) ×2 IMPLANT
KWIRE DBL END TROCAR 6X.045 (WIRE) IMPLANT
KWIRE DBL END TROCAR 6X.062 (WIRE) IMPLANT
KWIRE SNGL END 1.2X150 (MISCELLANEOUS) IMPLANT
NDL HYPO 18GX1.5 BLUNT FILL (NEEDLE) IMPLANT
NDL HYPO 25GX1X1/2 BEV (NEEDLE) IMPLANT
NEEDLE HYPO 18GX1.5 BLUNT FILL (NEEDLE) ×2 IMPLANT
NEEDLE HYPO 25GX1X1/2 BEV (NEEDLE) ×2 IMPLANT
NS IRRIG 500ML POUR BTL (IV SOLUTION) ×2 IMPLANT
PACK EXTREMITY ARMC (MISCELLANEOUS) ×2 IMPLANT
PAD CAST CTTN 3X4 STRL (SOFTGOODS) IMPLANT
PADDING CAST COTTON 3X4 STRL (SOFTGOODS) ×2
PENCIL SMOKE EVACUATOR (MISCELLANEOUS) ×2 IMPLANT
RASP SM TEAR CROSS CUT (RASP) ×1 IMPLANT
SCREW CANNULATED 4.0X36S ×1 IMPLANT
SCREW COUNTERSINK 4.0 HEADED (MISCELLANEOUS) IMPLANT
SPLINT CAST 1 STEP 4X30 (MISCELLANEOUS) ×2 IMPLANT
SPLINT FAST PLASTER 5X30 (CAST SUPPLIES) ×1
SPLINT PLASTER CAST FAST 5X30 (CAST SUPPLIES) IMPLANT
STOCKINETTE STRL 6IN 960660 (GAUZE/BANDAGES/DRESSINGS) ×2 IMPLANT
STRAP BODY AND KNEE 60X3 (MISCELLANEOUS) ×2 IMPLANT
STRIP CLOSURE SKIN 1/4X4 (GAUZE/BANDAGES/DRESSINGS) ×2 IMPLANT
SUT ETHILON 3-0 (SUTURE) ×1 IMPLANT
SUT VIC AB 2-0 SH 27 (SUTURE)
SUT VIC AB 2-0 SH 27XBRD (SUTURE) IMPLANT
SUT VIC AB 3-0 SH 27 (SUTURE) ×2
SUT VIC AB 3-0 SH 27X BRD (SUTURE) IMPLANT
SUT VIC AB 4-0 FS2 27 (SUTURE) ×2 IMPLANT
SUT VICRYL AB 3-0 FS1 BRD 27IN (SUTURE) IMPLANT
SYR 10ML LL (SYRINGE) ×1 IMPLANT

## 2018-07-09 NOTE — Anesthesia Postprocedure Evaluation (Signed)
Anesthesia Post Note  Patient: Jennifer Gardner  Procedure(s) Performed: ARTHRODESIS;HALLUS/IP JOINT (Right Foot)  Patient location during evaluation: PACU Anesthesia Type: General Level of consciousness: awake and alert, oriented and patient cooperative Pain management: pain level controlled Vital Signs Assessment: post-procedure vital signs reviewed and stable Respiratory status: spontaneous breathing, nonlabored ventilation and respiratory function stable Cardiovascular status: blood pressure returned to baseline and stable Postop Assessment: adequate PO intake Anesthetic complications: no    Darrin Nipper

## 2018-07-09 NOTE — Op Note (Signed)
Operative note   Surgeon: Dr. Albertine Patricia, DPM.    Assistant: None    Preop diagnosis: Hallux malleus right foot    Postop diagnosis: Same    Procedure:   1.  Interphalangeal joint fusion right hallux with screw fixation from [employer]agon          EBL: Less than 5 cc    Anesthesia: General/LMA delivered by anesthesia team.  I gave her 3 cc of 0.25% bupivacaine mixed with Exparel before the case and 3 cc of straight Exparel after the case at the base of the great toe    Hemostasis: Ankle tourniquet 225 mils mercury pressure for 35 minutes    Specimen: None    Complications: None    Operative indications: Chronic pain unresponsive to conservative care    Procedure:  Patient was brought into the OR and placed on the operating table in thesupine position. After anesthesia was obtained theright lower extremity was prepped and draped in usual sterile fashion.  Operative Report: This time attention directed to the IP joint of the right great toe 2 similar incisions were made over the IP joint transversely his lip skin was then removed bleeders clamped and bovied as required.  This point the long extensor tendon was incised transversely reflected proximally and distally away from the 2 aspects of the joint.  Articular cartilage was removed from the proximal phalanx head in the middle phalanx base.  Some bony proliferation on each side of the joint was then reduced with rongeurs as well as power rasping.  There was an copiously irrigated and the fresh Rall bone was drilled with a 2.0 drill for fenestration.  Next a K wire was run through the distal phalanx from the end of the toe and then the 2 aspects of the joint the distal proximal phalanx were approximated and the soft tissue was checked so that there is no interference or involution of the soft tissue into the fused area.  The area was compressed and the K wire was run proximally into the shaft of the proximal phalanx.  There is checked for  skin good position and correction was noted across the IPJ.  The pin position was good.  At this point the area was drilled following measurements.  A 36 mm screw from the Paragon set 4.0 screw was then placed across the fusion site running from distal to proximal.  This tightened down nicely in the area checked for skin good position and correction were noted at this point.  There was an copiously irrigated and the extensor tendon was reapproximated with 3-0 Vicryl simple interrupted sutures.  Skin was then closed with 4-0 nylon simple running horizontal mattress sutures together.  The small incision of the distal tip of the toe for insertion screw was closed with one 3-0 nylon simple interrupted stitch.  A sterile compressive dressings were placed across wound consisting of Xeroform gauze 4 x 4's Kling Kerlix and a posterior splint placed on the right foot and leg in the operating room.    Patient tolerated the procedure and anesthesia well.  Was transported from the OR to the PACU with all vital signs stable and vascular status intact. To be discharged per routine protocol.  Will follow up in approximately 1 week in the outpatient clinic.

## 2018-07-09 NOTE — Transfer of Care (Signed)
Immediate Anesthesia Transfer of Care Note  Patient: Jennifer Gardner  Procedure(s) Performed: ARTHRODESIS;HALLUS/IP JOINT (Right Foot)  Patient Location: PACU  Anesthesia Type: General  Level of Consciousness: awake, alert  and patient cooperative  Airway and Oxygen Therapy: Patient Spontanous Breathing and Patient connected to supplemental oxygen  Post-op Assessment: Post-op Vital signs reviewed, Patient's Cardiovascular Status Stable, Respiratory Function Stable, Patent Airway and No signs of Nausea or vomiting  Post-op Vital Signs: Reviewed and stable  Complications: No apparent anesthesia complications

## 2018-07-09 NOTE — Anesthesia Procedure Notes (Signed)
Procedure Name: LMA Insertion Date/Time: 07/09/2018 8:46 AM Performed by: Jeannene Patella, CRNA Pre-anesthesia Checklist: Patient identified, Emergency Drugs available, Suction available, Patient being monitored and Timeout performed Patient Re-evaluated:Patient Re-evaluated prior to induction Oxygen Delivery Method: Circle system utilized Preoxygenation: Pre-oxygenation with 100% oxygen Induction Type: IV induction Ventilation: Mask ventilation without difficulty LMA: LMA inserted LMA Size: 4.0 Placement Confirmation: positive ETCO2 and breath sounds checked- equal and bilateral

## 2018-07-09 NOTE — H&P (Signed)
H and P has been reviewed and no changes are noted.  

## 2018-07-09 NOTE — Discharge Instructions (Signed)
[place] DR. [place]   1. Take your medication as prescribed.  Pain medication should be taken only as needed.  2. Keep the dressing clean, dry and intact.  3. Keep your foot elevated above the heart level for the first 48 hours.  4. Walking to the bathroom and brief periods of walking are acceptable, unless we have instructed you to be non-weight bearing.  5. Always wear your post-op shoe when walking.  Always use your crutches if you are to be non-weight bearing.  6. Do not take a shower. Baths are permissible as long as the foot is kept out of the water.   7. Every hour you are awake:  - Bend your knee 15 times. - Flex foot 15 times - Massage calf 15 times  8. Call [facility] [phone]) if any of the following problems occur: - You develop a temperature or fever. - The bandage becomes saturated with blood. - Medication does not stop your pain. - Injury of the foot occurs. - Any symptoms of infection including redness, odor, or red streaks running from wound.  General Anesthesia, Adult, Care After This sheet gives you information about how to care for yourself after your procedure. Your health care provider may also give you more specific instructions. If you have problems or questions, contact your health care provider. What can I expect after the procedure? After the procedure, the following side effects are common:  Pain or discomfort at the IV site.  Nausea.  Vomiting.  Sore throat.  Trouble concentrating.  Feeling cold or chills.  Weak or tired.  Sleepiness and fatigue.  Soreness and body aches. These side effects can affect parts of the body that were not involved in surgery. Follow these instructions at home:  For at least 24 hours after the procedure:  Have a responsible adult stay with you. It is important to  have someone help care for you until you are awake and alert.  Rest as needed.  Do not: ? Participate in activities in which you could fall or become injured. ? Drive. ? Use heavy machinery. ? Drink alcohol. ? Take sleeping pills or medicines that cause drowsiness. ? Make important decisions or sign legal documents. ? Take care of children on your own. Eating and drinking  Follow any instructions from your health care provider about eating or drinking restrictions.  When you feel hungry, start by eating small amounts of foods that are soft and easy to digest (bland), such as toast. Gradually return to your regular diet.  Drink enough fluid to keep your urine pale yellow.  If you vomit, rehydrate by drinking water, juice, or clear broth. General instructions  If you have sleep apnea, surgery and certain medicines can increase your risk for breathing problems. Follow instructions from your health care provider about wearing your sleep device: ? Anytime you are sleeping, including during daytime naps. ? While taking prescription pain medicines, sleeping medicines, or medicines that make you drowsy.  Return to your normal activities as told by your health care provider. Ask your health care provider what activities are safe for you.  Take over-the-counter and prescription medicines only as told by your health care provider.  If you smoke, do not smoke without supervision.  Keep all follow-up visits as told by your health care provider. This is important. Contact a health care provider if:  You have nausea  or vomiting that does not get better with medicine.  You cannot eat or drink without vomiting.  You have pain that does not get better with medicine.  You are unable to pass urine.  You develop a skin rash.  You have a fever.  You have redness around your IV site that gets worse. Get help right away if:  You have difficulty breathing.  You have chest pain.  You  have blood in your urine or stool, or you vomit blood. Summary  After the procedure, it is common to have a sore throat or nausea. It is also common to feel tired.  Have a responsible adult stay with you for the first 24 hours after general anesthesia. It is important to have someone help care for you until you are awake and alert.  When you feel hungry, start by eating small amounts of foods that are soft and easy to digest (bland), such as toast. Gradually return to your regular diet.  Drink enough fluid to keep your urine pale yellow.  Return to your normal activities as told by your health care provider. Ask your health care provider what activities are safe for you. This information is not intended to replace advice given to you by your health care provider. Make sure you discuss any questions you have with your health care provider. Document Released: 09/09/2000 Document Revised: 01/17/2017 Document Reviewed: 01/17/2017 Elsevier Interactive Patient Education  2019 [place] for Discharge Teaching: EXPAREL (bupivacaine liposome injectable suspension)   Your surgeon or anesthesiologist gave you EXPAREL(bupivacaine) to help control your pain after surgery.   EXPAREL is a local anesthetic that provides pain relief by numbing the tissue around the surgical site.  EXPAREL is designed to release pain medication over time and can control pain for up to 72 hours.  Depending on how you respond to EXPAREL, you may require less pain medication during your recovery.  Possible side effects:  Temporary loss of sensation or ability to move in the area where bupivacaine was injected.  Nausea, vomiting, constipation  Rarely, numbness and tingling in your mouth or lips, lightheadedness, or anxiety may occur.  Call your doctor right away if you think you may be experiencing any of these sensations, or if you have other questions regarding possible side effects.  Follow all  other discharge instructions given to you by your surgeon or nurse. Eat a healthy diet and drink plenty of water or other fluids.  If you return to the hospital for any reason within 96 hours following the administration of EXPAREL, it is important for health care providers to know that you have received this anesthetic. A teal colored band has been placed on your arm with the date, time and amount of EXPAREL you have received in order to alert and inform your health care providers. Please leave this armband in place for the full 96 hours following administration, and then you may remove the band.

## 2018-07-10 ENCOUNTER — Encounter: Payer: Self-pay | Admitting: Podiatry

## 2018-08-17 ENCOUNTER — Other Ambulatory Visit: Payer: Self-pay | Admitting: Urology

## 2018-08-17 ENCOUNTER — Other Ambulatory Visit (HOSPITAL_COMMUNITY): Payer: Self-pay | Admitting: Urology

## 2018-08-17 DIAGNOSIS — N200 Calculus of kidney: Secondary | ICD-10-CM

## 2018-08-17 DIAGNOSIS — N23 Unspecified renal colic: Secondary | ICD-10-CM

## 2018-08-19 ENCOUNTER — Ambulatory Visit
Admission: RE | Admit: 2018-08-19 | Discharge: 2018-08-19 | Disposition: A | Discharge status: Home / Self Care | Payer: BLUE CROSS/BLUE SHIELD | Source: Ambulatory Visit | Attending: Urology | Admitting: Urology

## 2018-08-19 DIAGNOSIS — N200 Calculus of kidney: Secondary | ICD-10-CM | POA: Diagnosis present

## 2018-08-19 DIAGNOSIS — N23 Unspecified renal colic: Secondary | ICD-10-CM | POA: Insufficient documentation

## 2018-08-19 MED ORDER — IOHEXOL 300 MG/ML  SOLN
150.0000 mL | Freq: Once | INTRAMUSCULAR | Status: AC | PRN
  Administered 2018-08-19: 125 mL via INTRAVENOUS

## 2018-08-21 NOTE — H&P (Signed)
NAME: Jennifer Gardner, MONTEITH MEDICAL RECORD [id number] ACCOUNT [id number] DATE OF BIRTH:05-05-1956 FACILITY: [facility] LOCATION: [facility] PHYSICIAN:MICHAEL Farrel Conners, MD  HISTORY AND PHYSICAL  DATE OF ADMISSION:  08/25/2018  CHIEF COMPLAINT:  Left flank pain.  HISTORY OF PRESENT ILLNESS:  The patient is a 63 year old white female who presented with sudden onset of left flank pain associated with nausea and vomiting on 02/23.  She underwent CT scan on 03/04 which revealed an 8 mm obstructing left UPJ stone.   She comes in now for left retrograde with ureteroscopic ureterolithotomy using holmium laser and stent placement.  ALLERGIES:  PENICILLIN, SULFA, AMOXICILLIN AND Z-PAK.  CURRENT MEDICATIONS:  Estradiol, duloxetine, progesterone, rosuvastatin, topiramate, acyclovir, triamterene/hydrochlorothiazide, Zofran, tamsulosin, and Tylenol No. 3.  PAST SURGICAL HISTORY: 1.  Breast reduction 2008. 2.  Foot surgeries x4 from 2016 thru 2020.  PAST AND CURRENT MEDICAL CONDITIONS: 1.  Benign essential tremor. 2.  Hypertension. 3.  Hypercholesterolemia. 4.  Depression.  REVIEW OF SYSTEMS:  The patient denies chest pain, shortness of breath, diabetes, heart disease or stroke.  SOCIAL HISTORY:  The patient denied tobacco use.  She consumes 3 to 5 alcoholic beverages per week.  FAMILY HISTORY:  Father died of complications related to COPD at age 76.  Mother is living, age 56, without significant health problems.  The patient has a sister who is age 53 who has had kidney stones.  PHYSICAL EXAMINATION: VITAL SIGNS:  Height 5 feet 3, weight 172. GENERAL:  A well-nourished, white female in no distress. HEENT:  Sclerae were clear.  Pupils are equally round, reactive to light. NECK:  No palpable masses or tenderness.  Thyroid gland was smooth and nontender, without palpable nodules.  No audible carotid bruits. LYMPHATIC:  No palpable neck or inguinal adenopathy. PULMONARY:  Lungs clear to  auscultation. CARDIOVASCULAR:  Regular rhythm and rate without audible murmurs. ABDOMEN:  Soft, nontender abdomen.  No CVA tenderness.  Liver and spleen were not palpable or tender. GENITOURINARY AND RECTAL:  Deferred NEUROMUSCULAR:  Alert and oriented x3.  IMPRESSION:  Left ureteropelvic junction stone with hydronephrosis.  PLAN:  Left retrograde with left ureteroscopic ureterolithotomy using holmium laser with possible stent placement.  LN/NUANCE  D:08/20/2018 T:08/20/2018 JOB:[id number]

## 2018-08-24 ENCOUNTER — Other Ambulatory Visit: Payer: Self-pay

## 2018-08-24 ENCOUNTER — Encounter
Admission: RE | Admit: 2018-08-24 | Discharge: 2018-08-24 | Disposition: A | Discharge status: Home / Self Care | Payer: BLUE CROSS/BLUE SHIELD | Source: Ambulatory Visit | Attending: Urology | Admitting: Urology

## 2018-08-24 NOTE — Patient Instructions (Signed)
Your procedure is scheduled on: 08-25-18 Report to Same Day Surgery 2nd floor medical mall [facility] Entrance-take elevator on left to 2nd floor.  Check in with surgery information desk.) To find out your arrival time please call [phone] between 1PM - 3PM on 08-24-18  Remember: Instructions that are not followed completely may result in serious medical risk, up to and including death, or upon the discretion of your surgeon and anesthesiologist your surgery may need to be rescheduled.    _x___ 1. Do not eat food after midnight the night before your procedure. You may drink clear liquids up to 2 hours before you are scheduled to arrive at the hospital for your procedure.  Do not drink clear liquids within 2 hours of your scheduled arrival to the hospital.  Clear liquids include  --Water or Apple juice without pulp  --Clear carbohydrate beverage such as ClearFast or Gatorade  --Black Coffee or Clear Tea (No milk, no creamers, do not add anything to the coffee or Tea   ____Ensure clear carbohydrate drink on the way to the hospital for bariatric patients  ____Ensure clear carbohydrate drink 3 hours before surgery for Dr Dwyane Luo patients if physician instructed.   No gum chewing or hard candies.     __x__ 2. No Alcohol for 24 hours before or after surgery.   __x__3. No Smoking or e-cigarettes for 24 prior to surgery.  Do not use any chewable tobacco products for at least 6 hour prior to surgery   ____  4. Bring all medications with you on the day of surgery if instructed.    __x__ 5. Notify your doctor if there is any change in your medical condition     (cold, fever, infections).    x___6. On the morning of surgery brush your teeth with toothpaste and water.  You may rinse your mouth with mouth wash if you wish.  Do not swallow any toothpaste or mouthwash.   Do not wear jewelry, make-up, hairpins, clips or nail polish.  Do not wear lotions, powders, or perfumes. You may wear  deodorant.  Do not shave 48 hours prior to surgery. Men may shave face and neck.  Do not bring valuables to the hospital.    [facility] is not responsible for any belongings or valuables.               Contacts, dentures or bridgework may not be worn into surgery.  Leave your suitcase in the car. After surgery it may be brought to your room.  For patients admitted to the hospital, discharge time is determined by your  treatment team.  _  Patients discharged the day of surgery will not be allowed to drive home.  You will need someone to drive you home and stay with you the night of your procedure.    Please read over the following fact sheets that you were given:   [facility] Preparing for Surgery   _x___ TAKE THE FOLLOWING MEDICATION THE MORNING OF SURGERY WITH A SMALL SIP OF WATER. These include:  1. TOPAMAX  2.  3.  4.  5.  6.  ____Fleets enema or Magnesium Citrate as directed.   ____ Use CHG Soap or sage wipes as directed on instruction sheet   ____ Use inhalers on the day of surgery and bring to hospital day of surgery  ____ Stop Metformin and Janumet 2 days prior to surgery.    ____ Take 1/2 of usual insulin dose the night before  surgery and none on the morning surgery.   ____ Follow recommendations from Cardiologist, Pulmonologist or PCP regarding stopping Aspirin, Coumadin, Plavix ,Eliquis, Effient, or Pradaxa, and Pletal.  X____Stop Anti-inflammatories such as Advil, Aleve, Ibuprofen, Motrin, Naproxen, Naprosyn, Goodies powders or aspirin products. OK to take Tylenol # 3   _x___ Stop supplements until after surgery-PT STOPPED ALL HER SUPPLEMENTS ONCE SHE FOUND OUT SHE HAD KIDNEY STONE   ____ Bring C-Pap to the hospital.

## 2018-08-25 ENCOUNTER — Encounter
Admission: RE | Disposition: A | Discharge status: Home / Self Care | Payer: Self-pay | Source: Home / Self Care | Attending: Urology

## 2018-08-25 ENCOUNTER — Other Ambulatory Visit: Payer: Self-pay

## 2018-08-25 ENCOUNTER — Ambulatory Visit
Admission: RE | Admit: 2018-08-25 | Discharge: 2018-08-25 | Disposition: A | Discharge status: Home / Self Care | Payer: BLUE CROSS/BLUE SHIELD | Attending: Urology | Admitting: Urology

## 2018-08-25 ENCOUNTER — Ambulatory Visit: Payer: BLUE CROSS/BLUE SHIELD | Admitting: Anesthesiology

## 2018-08-25 ENCOUNTER — Encounter: Payer: Self-pay | Admitting: Anesthesiology

## 2018-08-25 DIAGNOSIS — F329 Major depressive disorder, single episode, unspecified: Secondary | ICD-10-CM | POA: Insufficient documentation

## 2018-08-25 DIAGNOSIS — E7800 Pure hypercholesterolemia, unspecified: Secondary | ICD-10-CM | POA: Insufficient documentation

## 2018-08-25 DIAGNOSIS — I10 Essential (primary) hypertension: Secondary | ICD-10-CM | POA: Diagnosis not present

## 2018-08-25 DIAGNOSIS — N132 Hydronephrosis with renal and ureteral calculous obstruction: Secondary | ICD-10-CM | POA: Insufficient documentation

## 2018-08-25 DIAGNOSIS — Z79890 Hormone replacement therapy (postmenopausal): Secondary | ICD-10-CM | POA: Insufficient documentation

## 2018-08-25 DIAGNOSIS — N201 Calculus of ureter: Secondary | ICD-10-CM

## 2018-08-25 DIAGNOSIS — Z79899 Other long term (current) drug therapy: Secondary | ICD-10-CM | POA: Diagnosis not present

## 2018-08-25 HISTORY — PX: CYSTOSCOPY W/ RETROGRADES: SHX1426

## 2018-08-25 HISTORY — PX: URETEROSCOPY WITH HOLMIUM LASER LITHOTRIPSY: SHX6645

## 2018-08-25 LAB — POCT I-STAT 4, (NA,K, GLUC, HGB,HCT)
Glucose, Bld: 88 mg/dL (ref 70–99)
HEMATOCRIT: 44 % (ref 36.0–46.0)
Hemoglobin: 15 g/dL (ref 12.0–15.0)
Potassium: 3.5 mmol/L (ref 3.5–5.1)
Sodium: 140 mmol/L (ref 135–145)

## 2018-08-25 SURGERY — URETEROSCOPY, WITH LITHOTRIPSY USING HOLMIUM LASER
Anesthesia: General | Site: Ureter

## 2018-08-25 MED ORDER — LEVOFLOXACIN IN D5W 500 MG/100ML IV SOLN
500.0000 mg | Freq: Once | INTRAVENOUS | Status: AC
  Administered 2018-08-25: 500 mg via INTRAVENOUS

## 2018-08-25 MED ORDER — LACTATED RINGERS IV SOLN
INTRAVENOUS | Status: DC
  Administered 2018-08-25 (×2): via INTRAVENOUS

## 2018-08-25 MED ORDER — HYOSCYAMINE SULFATE SL 0.125 MG SL SUBL: 0.1250 mg | SUBLINGUAL_TABLET | SUBLINGUAL | 4 refills | Status: DC | PRN

## 2018-08-25 MED ORDER — SEVOFLURANE IN SOLN
RESPIRATORY_TRACT | Status: AC
  Filled 2018-08-25: qty 250

## 2018-08-25 MED ORDER — DEXAMETHASONE SODIUM PHOSPHATE 10 MG/ML IJ SOLN
INTRAMUSCULAR | Status: DC | PRN
  Administered 2018-08-25: 10 mg via INTRAVENOUS

## 2018-08-25 MED ORDER — MIDAZOLAM HCL 2 MG/2ML IJ SOLN
INTRAMUSCULAR | Status: DC | PRN
  Administered 2018-08-25: 2 mg via INTRAVENOUS

## 2018-08-25 MED ORDER — LEVOFLOXACIN IN D5W 500 MG/100ML IV SOLN
INTRAVENOUS | Status: AC
  Filled 2018-08-25: qty 100

## 2018-08-25 MED ORDER — FENTANYL CITRATE (PF) 100 MCG/2ML IJ SOLN
25.0000 ug | INTRAMUSCULAR | Status: DC | PRN
  Administered 2018-08-25 (×2): 25 ug via INTRAVENOUS

## 2018-08-25 MED ORDER — LIDOCAINE HCL URETHRAL/MUCOSAL 2 % EX GEL
CUTANEOUS | Status: AC
  Filled 2018-08-25: qty 10

## 2018-08-25 MED ORDER — FENTANYL CITRATE (PF) 100 MCG/2ML IJ SOLN
INTRAMUSCULAR | Status: AC
  Filled 2018-08-25: qty 2

## 2018-08-25 MED ORDER — ONDANSETRON HCL 4 MG/2ML IJ SOLN
INTRAMUSCULAR | Status: DC | PRN
  Administered 2018-08-25: 4 mg via INTRAVENOUS

## 2018-08-25 MED ORDER — GLYCOPYRROLATE 0.2 MG/ML IJ SOLN
INTRAMUSCULAR | Status: DC | PRN
  Administered 2018-08-25: 0.2 mg via INTRAVENOUS

## 2018-08-25 MED ORDER — DEXMEDETOMIDINE HCL 200 MCG/2ML IV SOLN
INTRAVENOUS | Status: DC | PRN
  Administered 2018-08-25: 8 ug via INTRAVENOUS

## 2018-08-25 MED ORDER — SUGAMMADEX SODIUM 200 MG/2ML IV SOLN
INTRAVENOUS | Status: DC | PRN
  Administered 2018-08-25: 152.4 mg via INTRAVENOUS

## 2018-08-25 MED ORDER — BELLADONNA ALKALOIDS-OPIUM 16.2-60 MG RE SUPP
RECTAL | Status: AC
  Filled 2018-08-25: qty 1

## 2018-08-25 MED ORDER — PROPOFOL 10 MG/ML IV BOLUS
INTRAVENOUS | Status: AC
  Filled 2018-08-25: qty 20

## 2018-08-25 MED ORDER — FENTANYL CITRATE (PF) 100 MCG/2ML IJ SOLN
INTRAMUSCULAR | Status: DC | PRN
  Administered 2018-08-25 (×2): 50 ug via INTRAVENOUS

## 2018-08-25 MED ORDER — PROPOFOL 10 MG/ML IV BOLUS
INTRAVENOUS | Status: DC | PRN
  Administered 2018-08-25: 140 mg via INTRAVENOUS

## 2018-08-25 MED ORDER — LIDOCAINE HCL URETHRAL/MUCOSAL 2 % EX GEL
CUTANEOUS | Status: DC | PRN
  Administered 2018-08-25: 1 via URETHRAL

## 2018-08-25 MED ORDER — IOHEXOL 180 MG/ML  SOLN
INTRAMUSCULAR | Status: DC | PRN
  Administered 2018-08-25: 40 mL

## 2018-08-25 MED ORDER — FAMOTIDINE 20 MG PO TABS
20.0000 mg | ORAL_TABLET | Freq: Once | ORAL | Status: AC
  Administered 2018-08-25: 20 mg via ORAL

## 2018-08-25 MED ORDER — BELLADONNA ALKALOIDS-OPIUM 16.2-60 MG RE SUPP
RECTAL | Status: DC | PRN
  Administered 2018-08-25: 1 via RECTAL

## 2018-08-25 MED ORDER — ONDANSETRON HCL 4 MG/2ML IJ SOLN: 4.0000 mg | Freq: Once | INTRAMUSCULAR | Status: DC | PRN

## 2018-08-25 MED ORDER — MIDAZOLAM HCL 2 MG/2ML IJ SOLN
INTRAMUSCULAR | Status: AC
  Filled 2018-08-25: qty 2

## 2018-08-25 MED ORDER — FAMOTIDINE 20 MG PO TABS
ORAL_TABLET | ORAL | Status: AC
  Administered 2018-08-25: 20 mg via ORAL
  Filled 2018-08-25: qty 1

## 2018-08-25 MED ORDER — ROCURONIUM BROMIDE 100 MG/10ML IV SOLN
INTRAVENOUS | Status: DC | PRN
  Administered 2018-08-25: 50 mg via INTRAVENOUS

## 2018-08-25 SURGICAL SUPPLY — 31 items
BAG DRAIN CYSTO-URO LG1000N (MISCELLANEOUS) ×4 IMPLANT
BALLN URETL DIL 7X4 (MISCELLANEOUS) ×4
BALLOON URETL DIL 7X4 (MISCELLANEOUS) ×1 IMPLANT
BRUSH SCRUB EZ 1% IODOPHOR (MISCELLANEOUS) ×4 IMPLANT
CATH URETL 5X70 OPEN END (CATHETERS) ×4 IMPLANT
CNTNR SPEC 2.5X3XGRAD LEK (MISCELLANEOUS)
CONT SPEC 4OZ STER OR WHT (MISCELLANEOUS)
CONT SPEC 4OZ STRL OR WHT (MISCELLANEOUS)
CONTAINER SPEC 2.5X3XGRAD LEK (MISCELLANEOUS) IMPLANT
FIBER LASER 365 (Laser) ×2 IMPLANT
GLOVE BIO SURGEON STRL SZ7.5 (GLOVE) ×4 IMPLANT
GOWN STRL REUS W/ TWL LRG LVL3 (GOWN DISPOSABLE) ×3 IMPLANT
GOWN STRL REUS W/ TWL LRG LVL4 (GOWN DISPOSABLE) ×3 IMPLANT
GOWN STRL REUS W/ TWL XL LVL3 (GOWN DISPOSABLE) ×3 IMPLANT
GOWN STRL REUS W/TWL LRG LVL3 (GOWN DISPOSABLE) ×4
GOWN STRL REUS W/TWL LRG LVL4 (GOWN DISPOSABLE) ×4
GOWN STRL REUS W/TWL XL LVL3 (GOWN DISPOSABLE) ×4
GUIDEWIRE ANG ZIPWIRE 035X150 (WIRE) ×2 IMPLANT
GUIDEWIRE STR ZIPWIRE 035X150 (MISCELLANEOUS) ×4 IMPLANT
INTRODUCER DILATOR DOUBLE (INTRODUCER) ×2 IMPLANT
KIT TURNOVER CYSTO (KITS) ×4 IMPLANT
PACK CYSTO AR (MISCELLANEOUS) ×4 IMPLANT
SET CYSTO W/LG BORE CLAMP LF (SET/KITS/TRAYS/PACK) ×4 IMPLANT
SHEATH URETERAL 12FRX35CM (MISCELLANEOUS) ×2 IMPLANT
SOL .9 NS 3000ML IRR  AL (IV SOLUTION) ×1
SOL .9 NS 3000ML IRR AL (IV SOLUTION) ×3
SOL .9 NS 3000ML IRR UROMATIC (IV SOLUTION) ×3 IMPLANT
STENT URET 6FRX24 CONTOUR (STENTS) ×2 IMPLANT
SURGILUBE 2OZ TUBE FLIPTOP (MISCELLANEOUS) ×4 IMPLANT
SYR 10ML LL (SYRINGE) ×6 IMPLANT
WATER STERILE IRR 1000ML POUR (IV SOLUTION) ×4 IMPLANT

## 2018-08-25 NOTE — H&P (Signed)
Date of Initial H&P: 08/20/18  History reviewed, patient examined, no change in status, stable for surgery.

## 2018-08-25 NOTE — Anesthesia Procedure Notes (Signed)
Procedure Name: LMA Insertion Date/Time: 08/25/2018 2:38 PM Performed by: Lavone Orn, CRNA Pre-anesthesia Checklist: Patient identified, Emergency Drugs available, Suction available, Patient being monitored and Timeout performed Patient Re-evaluated:Patient Re-evaluated prior to induction Oxygen Delivery Method: Circle system utilized Preoxygenation: Pre-oxygenation with 100% oxygen Induction Type: IV induction Ventilation: Mask ventilation without difficulty LMA: LMA inserted LMA Size: 4.0 Number of attempts: 1 Placement Confirmation: positive ETCO2 and breath sounds checked- equal and bilateral Tube secured with: Tape Dental Injury: Teeth and Oropharynx as per pre-operative assessment

## 2018-08-25 NOTE — Transfer of Care (Signed)
Immediate Anesthesia Transfer of Care Note  Patient: Jennifer Gardner  Procedure(s) Performed: LEFT URETEROSCOPY WITH HOLMIUM LASER AND LEFT STENT PLACEMENT (Left Ureter) CYSTOSCOPY WITH RETROGRADE PYELOGRAM (N/A Ureter) URETERAL DILITATION (Left Ureter)  Patient Location: PACU  Anesthesia Type:General  Level of Consciousness: awake, alert  and oriented  Airway & Oxygen Therapy: Patient Spontanous Breathing and Patient connected to face mask oxygen  Post-op Assessment: Report given to RN and Post -op Vital signs reviewed and stable  Post vital signs: Reviewed and stable  Last Vitals:  Vitals Value Taken Time  BP 103/46 08/25/2018  3:52 PM  Temp    Pulse 85 08/25/2018  3:56 PM  Resp    SpO2 100 % 08/25/2018  3:56 PM  Vitals shown include unvalidated device data.  Last Pain:  Vitals:   08/25/18 1409  TempSrc: Oral  PainSc: 1          Complications: No apparent anesthesia complications

## 2018-08-25 NOTE — Anesthesia Preprocedure Evaluation (Signed)
Anesthesia Evaluation  Patient identified by MRN, date of birth, ID band Patient awake    Reviewed: Allergy & Precautions, NPO status , Patient's Chart, lab work & pertinent test results, reviewed documented beta blocker date and time   Airway Mallampati: III  TM Distance: >3 FB     Dental  (+) Chipped   Pulmonary           Cardiovascular hypertension, Pt. on medications      Neuro/Psych PSYCHIATRIC DISORDERS Depression    GI/Hepatic   Endo/Other    Renal/GU      Musculoskeletal  (+) Arthritis ,   Abdominal   Peds  Hematology   Anesthesia Other Findings   Reproductive/Obstetrics                             Anesthesia Physical Anesthesia Plan  ASA: III  Anesthesia Plan: General   Post-op Pain Management:    Induction: Intravenous  PONV Risk Score and Plan:   Airway Management Planned: LMA  Additional Equipment:   Intra-op Plan:   Post-operative Plan:   Informed Consent: I have reviewed the patients History and Physical, chart, labs and discussed the procedure including the risks, benefits and alternatives for the proposed anesthesia with the patient or authorized representative who has indicated his/her understanding and acceptance.       Plan Discussed with: CRNA  Anesthesia Plan Comments:         Anesthesia Quick Evaluation

## 2018-08-25 NOTE — Op Note (Signed)
Preoperative diagnosis: 1.  Left ureterolithiasis                                             2.  Left hydronephrosis                                             3.  Left renal colic  Postoperative diagnosis: Same  Procedure: 1.  Left ureteroscopic ureterolithotomy with holmium laser lithotripsy                      2.  Left double-pigtail ureteral stent placement                      3.  Retrograde pyelogram                      4.  Fluoroscopy  Surgeon: Otelia Limes. Yves Dill MD  Anesthesia: General  Indications:See the history and physical. After informed consent the above procedure(s) were requested     Technique and findings: After adequate general anesthesia had been obtained the patient was placed into dorsal lithotomy position and perineum was prepped and draped in the usual fashion.  A 21 French cystoscope was coupled to the camera and advanced into the bladder.  The bladder was thoroughly inspected.  No bladder mucosal lesions were identified.  The left ureteral orifice was cannulated with a 6 [place] open-ended ureteral catheter and retrograde pyelography was performed.  The patient had a 10 mm radiolucent stone impacted at the left UPJ.  A .035 Glidewire was placed passed beyond the stone and curled into the left renal pelvis.  A second 0.025 wire was placed in similar fashion.  The intramural ureter was then dilated to 7 mm with balloon dilating catheter.  The balloon dilating catheter was then removed taking care leave the guidewire in position.  Ureteral access sheath was then passed over the 0.035 guidewire.  The dual lumen flexible ureteroscope then passed up to the level the UPJ.  The UPJ was edematous and erythematous consistent with prior location of stone.  Stone had apparently migrated back into a lower pole calyx.[name] was then identified with the ureteroscope and the 360 micron holmium laser fiber was passed through the scope to the level of the stone.  Power was set at 0.5 J  and frequency at 10 Hz.  Stone was then powdered into fragments less than 0.5 mm in size.  The ureteroscope was then removed.  The access sheath was then removed.  The working guidewire was removed and the the wire was left in place.  Cystoscope was then backloaded over the guidewire and a 6 x 24 cm double-pigtail stent advanced over the guidewire and positioned in the ureter under fluoroscopic guidance.  The guidewire was then removed taking care leave the stent in position.  The placement the retrieval suture had been removed from the stent.  The bladder was drained and the cystoscope was removed.  10 cc of viscous Xylocaine was instilled within the urethra.  B&O suppository was placed.  Blood loss was negligible. Procedure was then terminated and patient transferred to the recovery room in stable condition.

## 2018-08-25 NOTE — Anesthesia Post-op Follow-up Note (Signed)
Anesthesia QCDR form completed.        

## 2018-08-25 NOTE — Discharge Instructions (Signed)
Kidney Stones  Kidney stones (urolithiasis) are rock-like masses that form inside of the kidneys. Kidneys are organs that make pee (urine). A kidney stone can cause very bad pain and can block the flow of pee. The stone usually leaves your body (passes) through your pee. You may need to have a doctor take out the stone. Follow these instructions at home: Eating and drinking  Drink enough fluid to keep your pee clear or pale yellow. This will help you pass the stone.  If told by your doctor, change the foods you eat (your diet). This may include: ? Limiting how much salt (sodium) you eat. ? Eating more fruits and vegetables. ? Limiting how much meat, poultry, fish, and eggs you eat.  Follow instructions from your doctor about eating or drinking restrictions. General instructions  Collect pee samples as told by your doctor. You may need to collect a pee sample: ? 24 hours after a stone comes out. ? 8-12 weeks after a stone comes out, and every 6-12 months after that.  Strain your pee every time you pee (urinate), for as long as told. Use the strainer that your doctor recommends.  Do not throw out the stone. Keep it so that it can be tested by your doctor.  Take over-the-counter and prescription medicines only as told by your doctor.  Keep all follow-up visits as told by your doctor. This is important. You may need follow-up tests. Preventing kidney stones To prevent another kidney stone:  Drink enough fluid to keep your pee clear or pale yellow. This is the best way to prevent kidney stones.  Eat healthy foods.  Avoid certain foods as told by your doctor. You may be told to eat less protein.  Stay at a healthy weight. Contact a doctor if:  You have pain that gets worse or does not get better with medicine. Get help right away if:  You have a fever or chills.  You get very bad pain.  You get new pain in your belly (abdomen).  You pass out (faint).  You cannot  pee. This information is not intended to replace advice given to you by your health care provider. Make sure you discuss any questions you have with your health care provider. Document Released: 11/20/2007 Document Revised: 02/20/2016 Document Reviewed: 02/20/2016 Elsevier Interactive Patient Education  2019 [place] for Kidney Stones Laser therapy for kidney stones is a procedure to break up small, hard mineral deposits that form in the kidney (kidney stones). The procedure is done using a device that produces a focused beam of light (laser). The laser breaks up kidney stones into pieces that are small enough to be passed out of the body through urination or removed from the body. You may need laser therapy if you have kidney stones that are painful or block your urinary tract. Thisprocedure is done by inserting a tube (ureteroscope) into your kidney through the urethral opening. The urethra is the tube that caries urine out of the body. In women, the urethra opens above the vaginal opening. In men, the urethra opens at the tip of the penis. The ureteroscope is inserted through the urethra, and surgical instruments are moved through the bladder and the tube that connects the kidney to the bladder (ureter) until they reach the kidney. Tell a health care provider about:  Any allergies you have.  All medicines you are taking, including vitamins, herbs, eye drops, creams, and over-the-counter medicines.  Any problems  you or family members have had with anesthetic medicines.  Any blood disorders you have.  Any surgeries you have had.  Any medical conditions you have.  Whether you are pregnant or may be pregnant. What are the risks? Generally, this is a safe procedure. However, problems may occur, including:  Infection.  Bleeding.  Allergic reactions to medicines.  Damage to the urethra, bladder, or ureter.  Urinary tract infection (UTI).  Narrowing of the  urethra (urethral stricture).  Difficulty passing urine.  Blockage of the kidney caused by a fragment of kidney stone. What happens before the procedure?  Follow instructions from your health care provider about eating or drinking restrictions.  Ask your health care provider about: ? Changing or stopping your regular medicines. This is especially important if you are taking diabetes medicines or blood thinners. ? Taking medicines such as aspirin and ibuprofen. These medicines can thin your blood. Do not take these medicines before your procedure if your health care provider instructs you not to.  You may have a physical exam before the procedure. The exam may include imaging studies and blood or urine tests.  You may be given antibiotic medicine to treat or prevent infection.  If your ureter is too narrow, your health care provider may place a soft, flexible tube (stent) inside of it. The stent may be placed days or weeks before your laser therapy procedure.  Ask your health care provider how your surgical site will be marked or identified.  Plan to have someone take you home after the procedure.  If you will be going home right after the procedure, plan to have someone stay with you for 24 hours. What happens during the procedure?   To reduce your risk of infection: ? Your health care team will wash or sanitize their hands. ? Your skin will be washed with soap.  An IV tube will be inserted into one of your veins.  You will be given one or more of the following: ? A medicine to help you relax (sedative). ? A medicine to numb the area (local anesthetic). ? A medicine to make you fall asleep (general anesthetic).  A ureteroscope will be inserted into your urethra. The ureteroscope will send images to a video screen in the operating room to guide your surgeon to the area of your kidney that will be treated.  A tube will be threaded through your bladder and ureter, up to your  kidney.  The laser device will be inserted into your kidney through the tube. Your surgeon will pulse the laser on and off to break up kidney stones.  A surgical instrument that has a tiny wire basket may be inserted into your kidney to remove the pieces of broken kidney stone. The procedure may vary among health care providers and hospitals. What happens after the procedure?  Your blood pressure, heart rate, breathing rate, and blood oxygen level will be monitored often until the medicines you were given have worn off.  You will be given pain medicine as needed.  You may continue to receive antibiotics.  You may have a stent temporarily placed in your ureter.  You may be asked to strain your urine to collect any stone fragments that you pass. These fragments may be tested.  Do not drive for 24 hours if you received a sedative. This information is not intended to replace advice given to you by your health care provider. Make sure you discuss any questions you have with your health  care provider. Document Released: 06/30/2015 Document Revised: 06/13/2017 Document Reviewed: 04/27/2015 Elsevier Interactive Patient Education  2019 [employer] Ureteral Stent Implantation, Care After Refer to this sheet in the next few weeks. These instructions provide you with information about caring for yourself after your procedure. Your health care provider may also give you more specific instructions. Your treatment has been planned according to current medical practices, but problems sometimes occur. Call your health care provider if you have any problems or questions after your procedure. What can I expect after the procedure? After the procedure, it is common to have:  Nausea.  Mild pain when you urinate. You may feel this pain in your lower back or lower abdomen. Pain should stop within a few minutes after you urinate. This may last for up to 1 week.  A small amount of blood in your urine for  several days. Follow these instructions at home:  Medicines  Take over-the-counter and prescription medicines only as told by your health care provider.  If you were prescribed an antibiotic medicine, take it as told by your health care provider. Do not stop taking the antibiotic even if you start to feel better.  Do not drive for 24 hours if you received a sedative.  Do not drive or operate heavy machinery while taking prescription pain medicines. Activity  Return to your normal activities as told by your health care provider. Ask your health care provider what activities are safe for you.  Do not lift anything that is heavier than 10 lb (4.5 kg). Follow this limit for 1 week after your procedure, or for as long as told by your health care provider. General instructions  Watch for any blood in your urine. Call your health care provider if the amount of blood in your urine increases.  If you have a catheter: ? Follow instructions from your health care provider about taking care of your catheter and collection bag. ? Do not take baths, swim, or use a hot tub until your health care provider approves.  Drink enough fluid to keep your urine clear or pale yellow.  Keep all follow-up visits as told by your health care provider. This is important. Contact a health care provider if:  You have pain that gets worse or does not get better with medicine, especially pain when you urinate.  You have difficulty urinating.  You feel nauseous or you vomit repeatedly during a period of more than 2 days after the procedure. Get help right away if:  Your urine is dark red or has blood clots in it.  You are leaking urine (have incontinence).  The end of the stent comes out of your urethra.  You cannot urinate.  You have sudden, sharp, or severe pain in your abdomen or lower back.  You have a fever. This information is not intended to replace advice given to you by your health care  provider. Make sure you discuss any questions you have with your health care provider. Document Released: 02/03/2013 Document Revised: 11/09/2015 Document Reviewed: 12/16/2014 Elsevier Interactive Patient Education  2019 [employer].

## 2018-08-28 NOTE — Anesthesia Postprocedure Evaluation (Signed)
Anesthesia Post Note  Patient: Jennifer Gardner  Procedure(s) Performed: LEFT URETEROSCOPY WITH HOLMIUM LASER AND LEFT STENT PLACEMENT (Left Ureter) CYSTOSCOPY WITH RETROGRADE PYELOGRAM (N/A Ureter) URETERAL DILITATION (Left Ureter)  Patient location during evaluation: PACU Anesthesia Type: General Level of consciousness: awake and alert Pain management: pain level controlled Vital Signs Assessment: post-procedure vital signs reviewed and stable Respiratory status: spontaneous breathing, nonlabored ventilation, respiratory function stable and patient connected to nasal cannula oxygen Cardiovascular status: blood pressure returned to baseline and stable Postop Assessment: no apparent nausea or vomiting Anesthetic complications: no     Last Vitals:  Vitals:   08/25/18 1646 08/25/18 1708  BP: 112/65 127/68  Pulse: 65 72  Resp: 16 16  Temp: (!) 35.9 C   SpO2: 97% 100%    Last Pain:  Vitals:   08/25/18 1646  TempSrc: Tympanic  PainSc: 3                  Molli Barrows

## 2019-03-11 ENCOUNTER — Telehealth: Payer: Self-pay | Admitting: Neurology

## 2019-03-11 NOTE — Telephone Encounter (Signed)
Noted! Thank you

## 2019-03-11 NOTE — Telephone Encounter (Signed)
Fine to make f/u.  Okay for VV but put in NP slot

## 2019-03-11 NOTE — Telephone Encounter (Signed)
Can you put both of theses messages together into a telephone message for me to send to Dr. Carles Collet.

## 2019-03-11 NOTE — Telephone Encounter (Signed)
Patient called back to get scheduled and she also said that she has not see another neurologist and she is also still taking the same Topiramate, Thanks

## 2019-03-11 NOTE — Telephone Encounter (Signed)
This patient called in needing to follow up with Dr. Carles Collet regarding her Tremors. She said that they have really gotten worse since 2018. Can she have an E visit or in office visit. She said she trusts Dr. Doristine Devoid opinion. I put her down temporarily on a Virtual visit to hold it due to her not having any available until February. Please Advise. Thank you

## 2019-03-11 NOTE — Telephone Encounter (Signed)
-----   Message from Ranae Plumber, [place] sent at 03/11/2019  8:04 AM EDT ----- Regarding: RE: Patient from [facility]  Can you put both of theses messages together into a telephone message for me to send to Dr. Carles Collet.  Thanks,Apple ----- Message ----- From: Elenora Fender Sent: 03/10/2019   3:48 PM EDT To: Ranae Plumber, CMA Subject: Patient from 2018/ Tremors                     Hi Apple,  This patient called in needing to follow up with Dr. Carles Collet regarding her Tremors. She said that they have really gotten worse since 2018. Can she have an E visit or in office visit. She said she trusts Dr. Doristine Devoid opinion. I put her down temporarily on a Virtual visit to hold it due to her not having any available until February. Please Advise.    Thank you  Theadora Rama

## 2019-03-11 NOTE — Telephone Encounter (Signed)
Called patient to get more information regarding medications she is currently taken and if she has been followed by another provider for tremor. No answer left message to call office back with this information.   Please be aware of this
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
## 2019-03-23 ENCOUNTER — Other Ambulatory Visit: Payer: Self-pay

## 2019-03-24 ENCOUNTER — Other Ambulatory Visit: Payer: Self-pay

## 2019-03-24 ENCOUNTER — Encounter: Payer: Self-pay | Admitting: Neurology

## 2019-03-24 ENCOUNTER — Telehealth (INDEPENDENT_AMBULATORY_CARE_PROVIDER_SITE_OTHER): Payer: BC Managed Care – PPO | Admitting: Neurology

## 2019-03-24 ENCOUNTER — Telehealth: Payer: Self-pay | Admitting: Neurology

## 2019-03-24 VITALS — Ht 62.0 in | Wt 165.0 lb

## 2019-03-24 DIAGNOSIS — F330 Major depressive disorder, recurrent, mild: Secondary | ICD-10-CM

## 2019-03-24 DIAGNOSIS — G250 Essential tremor: Secondary | ICD-10-CM

## 2019-03-24 MED ORDER — TRIHEXYPHENIDYL HCL 2 MG PO TABS: 2.0000 mg | ORAL_TABLET | Freq: Two times a day (BID) | ORAL | 1 refills | Status: DC

## 2019-03-24 NOTE — Telephone Encounter (Signed)
Please call pt and let her know that I called urologist and spoke with him myself.  He said to leave her on the topamax since it was just the first stone and likely not related.

## 2019-03-24 NOTE — Telephone Encounter (Signed)
Called patient no answer left message with provider response below

## 2019-06-24 ENCOUNTER — Other Ambulatory Visit: Payer: Self-pay | Admitting: Physician Assistant

## 2019-06-24 DIAGNOSIS — Z1231 Encounter for screening mammogram for malignant neoplasm of breast: Secondary | ICD-10-CM

## 2019-06-30 ENCOUNTER — Ambulatory Visit
Admission: RE | Admit: 2019-06-30 | Discharge: 2019-06-30 | Disposition: A | Discharge status: Home / Self Care | Payer: BC Managed Care – PPO | Source: Ambulatory Visit | Attending: Physician Assistant | Admitting: Physician Assistant

## 2019-06-30 DIAGNOSIS — Z1231 Encounter for screening mammogram for malignant neoplasm of breast: Secondary | ICD-10-CM

## 2019-07-23 DIAGNOSIS — C4491 Basal cell carcinoma of skin, unspecified: Secondary | ICD-10-CM

## 2019-07-23 DIAGNOSIS — C4492 Squamous cell carcinoma of skin, unspecified: Secondary | ICD-10-CM

## 2019-07-23 HISTORY — DX: Squamous cell carcinoma of skin, unspecified: C44.92

## 2019-07-23 HISTORY — DX: Basal cell carcinoma of skin, unspecified: C44.91

## 2019-08-06 IMAGING — MG DIGITAL SCREENING BILATERAL MAMMOGRAM WITH TOMO AND CAD
8 series · 8 of 24 positions shown · non-contrast
Comparison: Previous exam(s).

ACR Breast Density Category a: The breast tissue is almost entirely
fatty.

CLINICAL DATA: Screening.

EXAM:
DIGITAL SCREENING BILATERAL MAMMOGRAM WITH TOMO AND CAD

[L CC synth-2D]
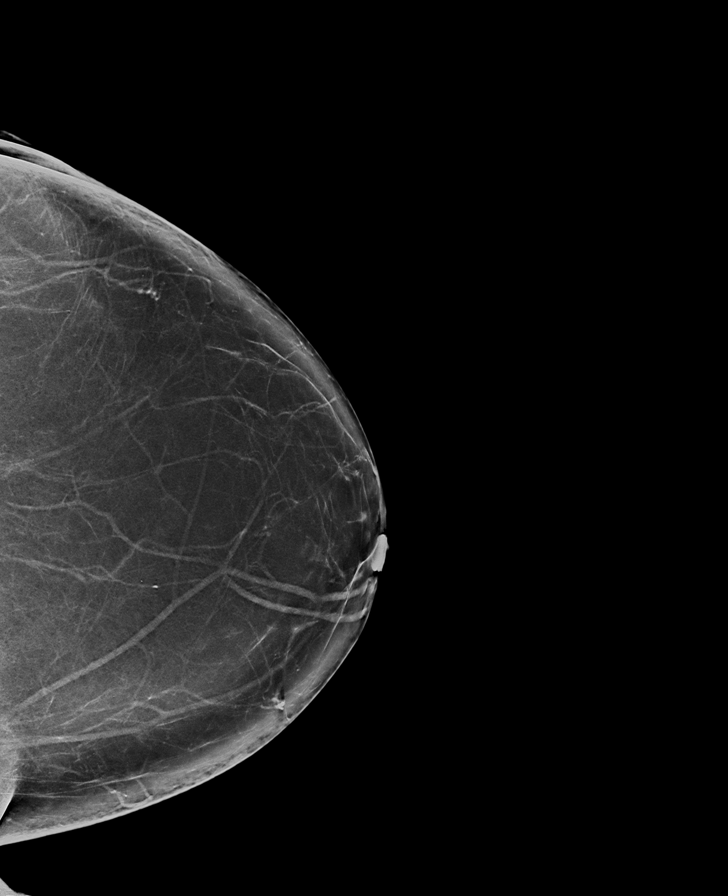

[R CC synth-2D]
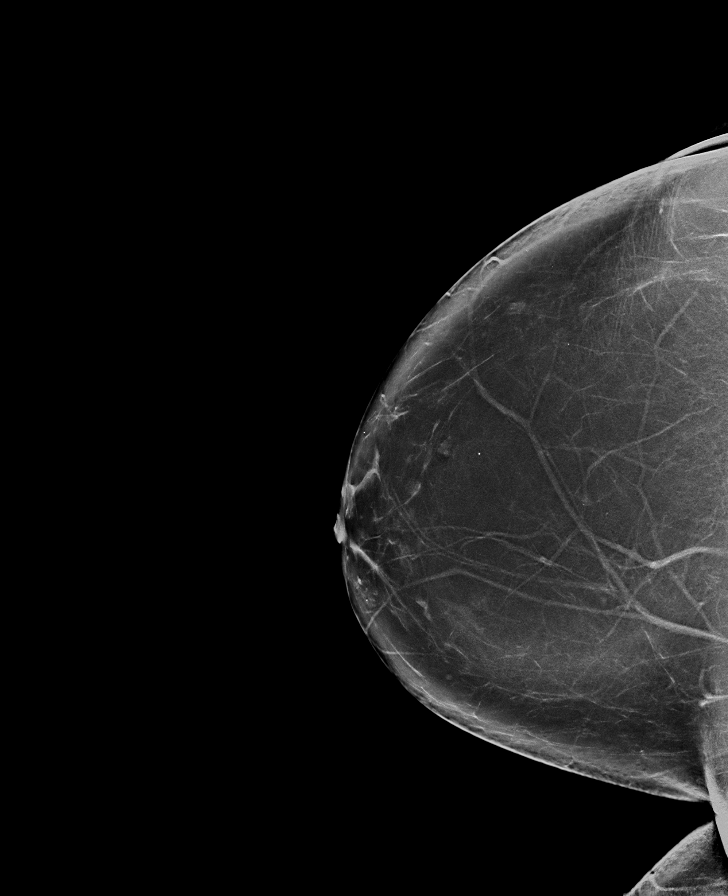

[L MLO synth-2D]
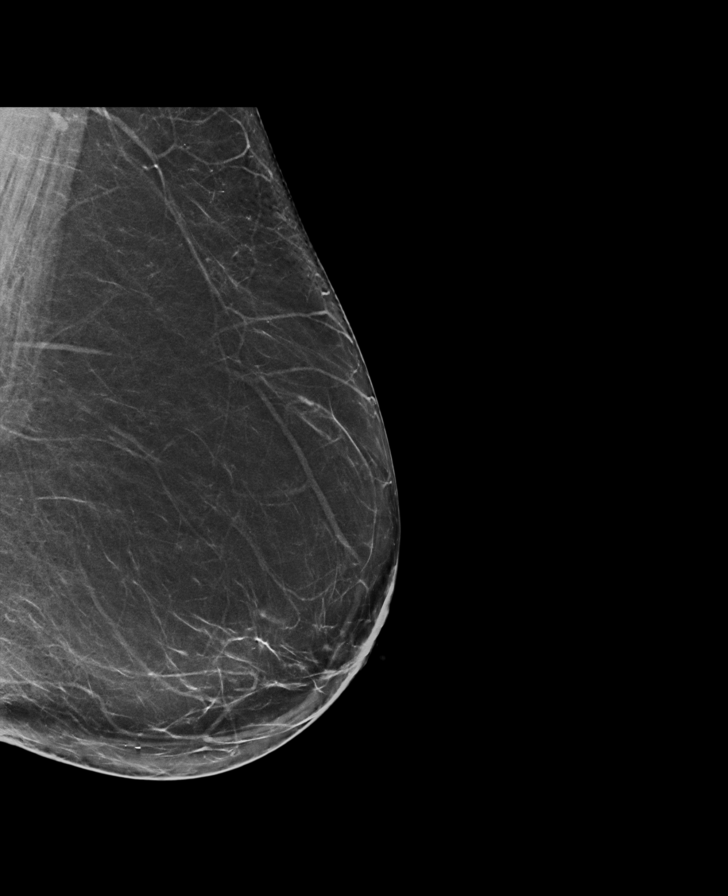

[R MLO synth-2D]
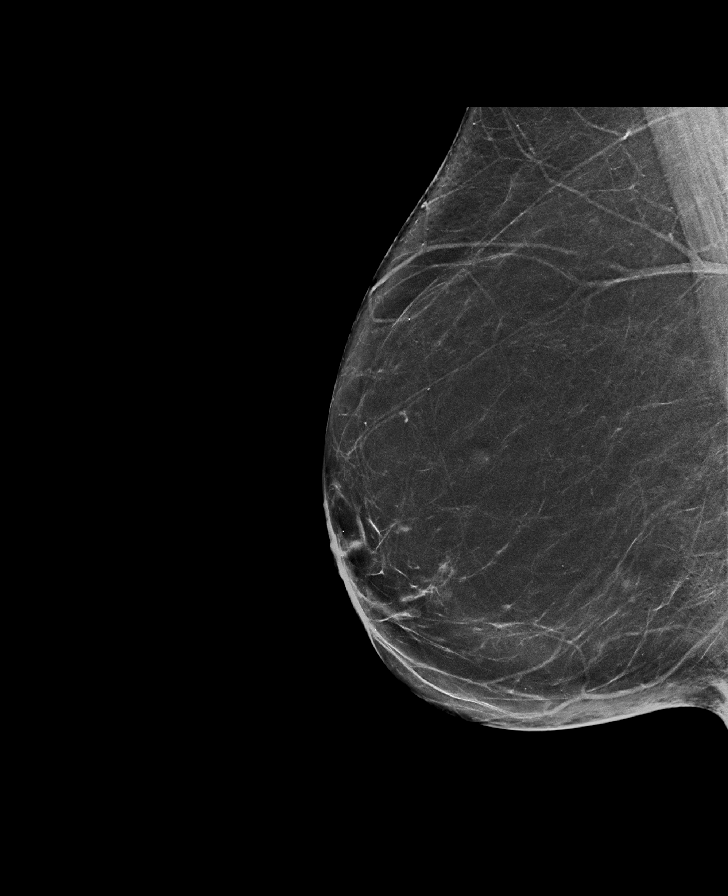

[R MLO tomo · tomo slice 38/75.0]
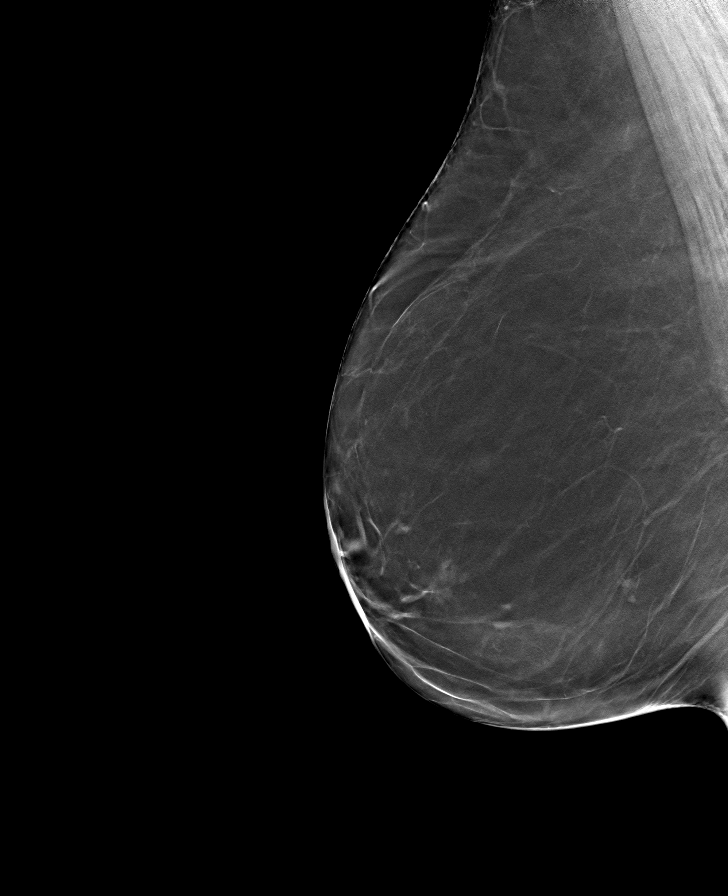

[R CC tomo · tomo slice 43/84.0]
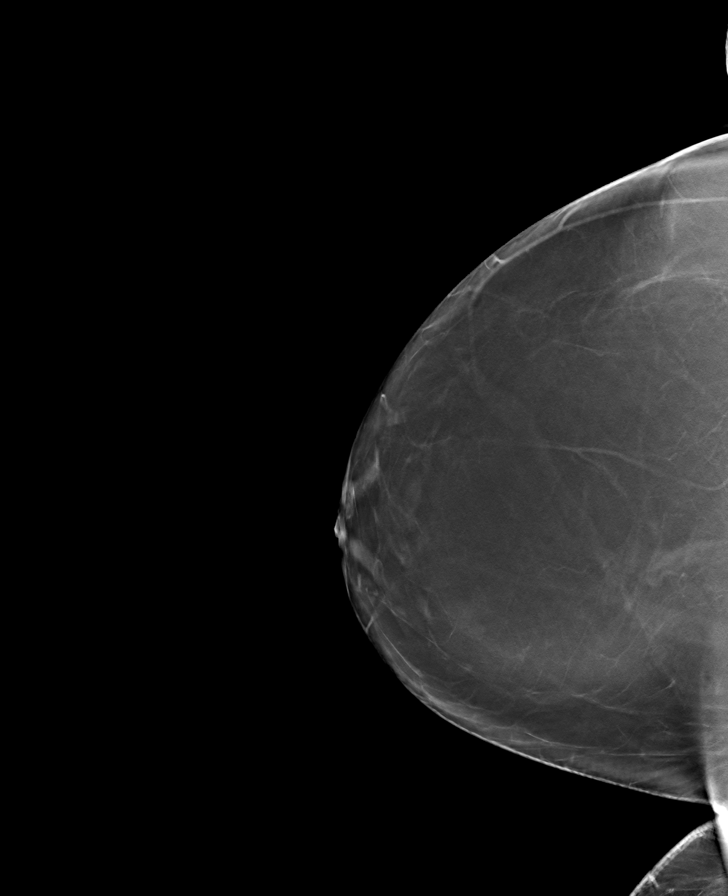

[L MLO tomo · tomo slice 39/77.0]
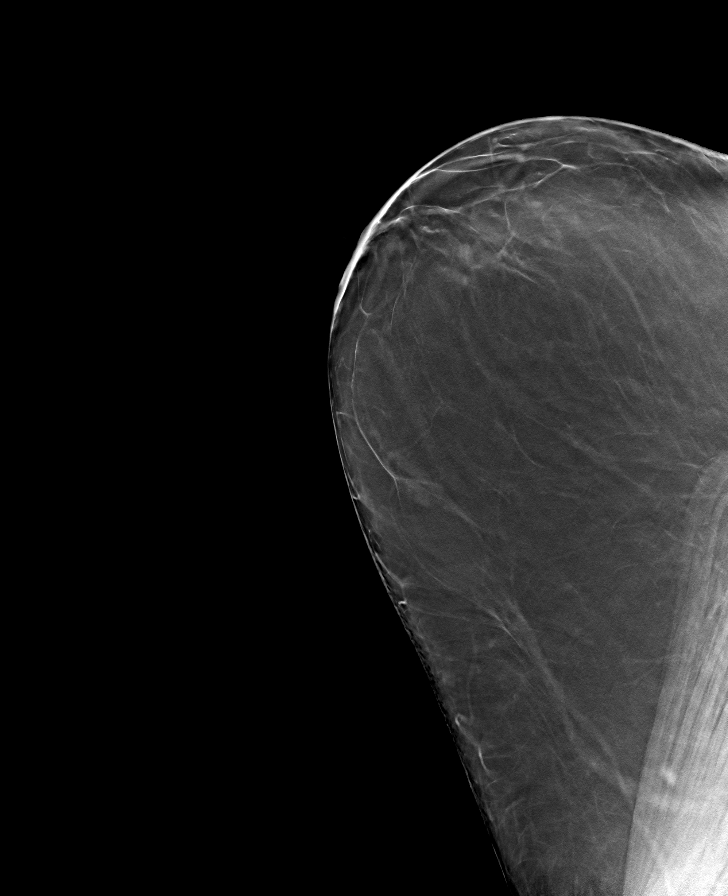

[L CC tomo · tomo slice 42/83.0]
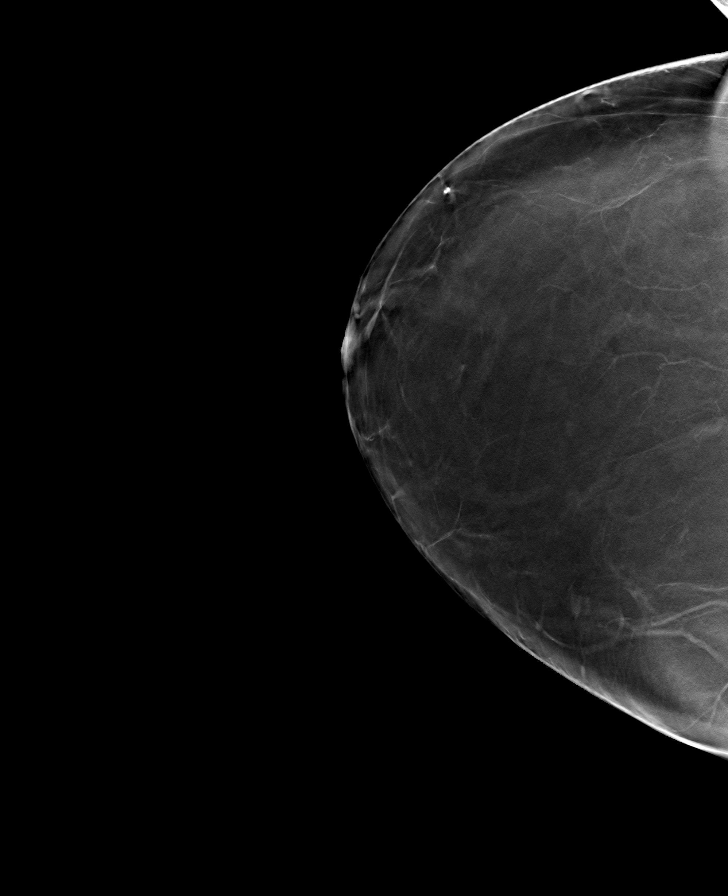

[8 of 24 positions shown; findings below may reference images not displayed]

FINDINGS: There are no findings suspicious for malignancy. Images were
processed with CAD.
IMPRESSION: No mammographic evidence of malignancy. A result letter of this
screening mammogram will be mailed directly to the patient.

RECOMMENDATION:
Screening mammogram in one year. (Code:8Y-[id number])

BI-RADS CATEGORY  1: Negative.

## 2019-08-24 NOTE — Progress Notes (Signed)
Virtual Visit via Video Note The purpose of this virtual visit is to provide medical care while limiting exposure to the novel coronavirus.    Consent was obtained for video visit:  Yes.   Answered questions that patient had about telehealth interaction:  Yes.   I discussed the limitations, risks, security and privacy concerns of performing an evaluation and management service by telemedicine. I also discussed with the patient that there may be a patient responsible charge related to this service. The patient expressed understanding and agreed to proceed.  Pt location: Home Physician Location: home Name of referring provider:  [name], MD I connected with Jennifer Gardner at patients initiation/request on 08/25/2019 at 11:15 AM EST by video enabled telemedicine application and verified that I am speaking with the correct person using two identifiers. Pt MRN:  [id number] Pt DOB:  1956/05/02 Video Participants:  Salina April;     History of Present Illness:  Patient seen today in follow-up for essential tremor.  Have talked with urologist since last visit, given her large kidney stone.  He did not think it was related to Topamax and given that this was a single, isolated stone, he told me to go ahead and leave her on it.  Patient remains on Topamax, 100 mg twice per day.  Trihexyphenidyl was also added last visit for mouth tremor.  She reports that it initially caused dry mouth and helpful enough to stay on the medication.  Hasn't had significant jaw tremor at all. No falls.    Current movement d/o meds:  Topamax, 100 mg twice daily Artane, 2 mg twice per day (started last visit)  Prior medications: Primidone    Current Outpatient Medications on File Prior to Visit  Medication Sig Dispense Refill  . acetaminophen-codeine (TYLENOL #3) 300-30 MG tablet Take 1 tablet by mouth every 4 (four) hours as needed for pain.    Marland Kitchen acyclovir (ZOVIRAX) 400 MG tablet Take 1 tablet (400 mg  total) by mouth 2 (two) times daily. 180 tablet 1  . B Complex-C (SUPER B COMPLEX PO) Take 1 capsule by mouth daily.     . Calcium Carb-Cholecalciferol (CALCIUM + D3 PO) Take 1 tablet by mouth daily.     . DULoxetine (CYMBALTA) 30 MG capsule take 1 capsule by mouth once daily (Patient taking differently: Take 30 mg by mouth every evening. ) 90 capsule 3  . estradiol (VIVELLE-DOT) 0.05 MG/24HR patch apply 1 patch TWICE A WEEK (Patient taking differently: Place 1 patch onto the skin 2 (two) times a week. Wednesdays & Sundays.) 16 patch 6  . progesterone (PROMETRIUM) 100 MG capsule take 1 capsule by mouth daily (Patient taking differently: Take 100 mg by mouth every evening. ) 30 capsule 3  . rosuvastatin (CRESTOR) 20 MG tablet take 1 tablet by mouth once daily (Patient taking differently: Take 20 mg by mouth every evening. ) 90 tablet 0  . topiramate (TOPAMAX) 100 MG tablet Take 1 tablet (100 mg total) by mouth 2 (two) times daily. 180 tablet 1  . triamterene-hydrochlorothiazide (MAXZIDE-25) 37.5-25 MG tablet take 1 tablet by mouth once daily 90 tablet 0  . trihexyphenidyl (ARTANE) 2 MG tablet Take 1 tablet (2 mg total) by mouth 2 (two) times daily with a meal. 180 tablet 1   No current facility-administered medications on file prior to visit.     Observations/Objective:   Vitals:   08/25/19 0834  Weight: 165 lb (74.8 kg)  Height: 5\' 2"  (1.575 m)  GEN:  The patient appears stated age and is in NAD.  Neurological examination:  Orientation: The patient is alert and oriented x3. Cranial nerves: There is good facial symmetry. There is no facial hypomimia.  The speech is fluent and clear.  Hearing is intact to conversational tone. Motor: Strength is at least antigravity x 4.   Movement examination: Tone: unable Abnormal movements: There is mild to moderate postural tremor on the left.  Very rare jaw tremor, which is improved significantly   I have reviewed and interpreted the following  labs independently Patient had lab work on May 12, 2019.  White blood cells were 6.7, hemoglobin 14.7, hematocrit 45.0 and platelets 262.  Sodium was 140, potassium 3.7, chloride 104, CO2 31, BUN 16, creatinine 0.8, glucose 105, AST 20, ALT 25, hemoglobin A1c 6.2  Assessment and Plan:   1.  Essential tremor, with family history of such  -She has had previous DaTscan that was negative.  -Continue Topamax, 100 mg twice per day  -Continue trihexyphenidyl, 2 mg twice per day  Follow Up Instructions:  1 year  -I discussed the assessment and treatment plan with the patient. The patient was provided an opportunity to ask questions and all were answered. The patient agreed with the plan and demonstrated an understanding of the instructions.   The patient was advised to call back or seek an in-person evaluation if the symptoms worsen or if the condition fails to improve as anticipated.     Alonza Bogus, DO

## 2019-08-25 ENCOUNTER — Encounter: Payer: Self-pay | Admitting: Neurology

## 2019-08-25 ENCOUNTER — Telehealth (INDEPENDENT_AMBULATORY_CARE_PROVIDER_SITE_OTHER): Payer: BC Managed Care – PPO | Admitting: Neurology

## 2019-08-25 ENCOUNTER — Other Ambulatory Visit: Payer: Self-pay

## 2019-08-25 VITALS — Ht 62.0 in | Wt 165.0 lb

## 2019-08-25 DIAGNOSIS — G250 Essential tremor: Secondary | ICD-10-CM | POA: Diagnosis not present

## 2019-09-08 ENCOUNTER — Other Ambulatory Visit: Payer: Self-pay

## 2019-09-08 MED ORDER — TRIHEXYPHENIDYL HCL 2 MG PO TABS: 2.0000 mg | ORAL_TABLET | Freq: Two times a day (BID) | ORAL | 1 refills | Status: DC

## 2019-09-08 NOTE — Telephone Encounter (Signed)
Rx(s) sent to pharmacy electronically.  

## 2019-09-29 ENCOUNTER — Ambulatory Visit (INDEPENDENT_AMBULATORY_CARE_PROVIDER_SITE_OTHER): Payer: BC Managed Care – PPO

## 2019-09-29 ENCOUNTER — Telehealth: Payer: Self-pay

## 2019-09-29 ENCOUNTER — Other Ambulatory Visit: Payer: Self-pay

## 2019-09-29 DIAGNOSIS — L570 Actinic keratosis: Secondary | ICD-10-CM

## 2019-09-29 MED ORDER — AMINOLEVULINIC ACID HCL 20 % EX SOLR
2.0000 "application " | Freq: Once | CUTANEOUS | Status: AC
  Administered 2019-09-29: 708 mg via TOPICAL

## 2019-09-29 NOTE — Patient Instructions (Signed)

## 2019-09-29 NOTE — Progress Notes (Signed)
Patient completed PDT therapy today.  AK (actinic keratosis) (2) Left Lower Leg - Anterior; Right Lower Leg - Anterior  Photodynamic therapy - Left Lower Leg - Anterior, Right Lower Leg - Anterior Procedure discussed: discussed risks, benefits, side effects. and alternatives   Prep: site scrubbed/prepped with acetone   Location:  Lower legs Number of lesions:  Multiple Type of treatment:  Blue light Aminolevulinic Acid (see MAR for details): Levulan Number of Levulan sticks used:  2 Incubation time (minutes):  120 Number of minutes under lamp:  16 Number of seconds under lamp:  40 Cooling:  Floor fan Outcome: patient tolerated procedure well with no complications   Post-procedure details: sunscreen applied    Aminolevulinic Acid HCl 20 % SOLR 708 mg - Left Lower Leg - Anterior, Right Lower Leg - Anterior

## 2019-09-29 NOTE — Telephone Encounter (Signed)
Hollie took care of this patient today when she returned at 4:15. She trimmed the edges of suture and Monday appointment is no longer needed.

## 2019-09-29 NOTE — Telephone Encounter (Signed)
Patient was here today for PDT treatment and questioned if she had spitting sutures on her forehead. She had a excision done several weeks ago and now has two little scabs at each end of excision site. Jennifer Gardner came in and looked at patient as well and thought she should see you.   I have scheduled patient for Monday at 12:00.  Are you OK with this? Do you have any other advice for patient?

## 2019-10-04 ENCOUNTER — Ambulatory Visit: Payer: BC Managed Care – PPO | Admitting: Dermatology

## 2019-11-04 ENCOUNTER — Telehealth: Payer: Self-pay

## 2019-11-04 NOTE — Telephone Encounter (Signed)
Pt called she have noticed a new spot pop up on her leg, she think may be a skin cancer.   Return to the office to have this area examined

## 2019-11-29 ENCOUNTER — Encounter: Payer: Self-pay | Admitting: Dermatology

## 2019-11-29 ENCOUNTER — Ambulatory Visit: Payer: BC Managed Care – PPO | Admitting: Dermatology

## 2019-11-29 ENCOUNTER — Other Ambulatory Visit: Payer: Self-pay

## 2019-11-29 DIAGNOSIS — C44729 Squamous cell carcinoma of skin of left lower limb, including hip: Secondary | ICD-10-CM | POA: Diagnosis not present

## 2019-11-29 DIAGNOSIS — Z85828 Personal history of other malignant neoplasm of skin: Secondary | ICD-10-CM

## 2019-11-29 DIAGNOSIS — L565 Disseminated superficial actinic porokeratosis (DSAP): Secondary | ICD-10-CM | POA: Diagnosis not present

## 2019-11-29 DIAGNOSIS — L578 Other skin changes due to chronic exposure to nonionizing radiation: Secondary | ICD-10-CM | POA: Diagnosis not present

## 2019-11-29 DIAGNOSIS — D492 Neoplasm of unspecified behavior of bone, soft tissue, and skin: Secondary | ICD-10-CM

## 2019-11-29 DIAGNOSIS — D485 Neoplasm of uncertain behavior of skin: Secondary | ICD-10-CM | POA: Diagnosis not present

## 2019-11-29 NOTE — Progress Notes (Signed)
Follow-Up Visit   Subjective  Jennifer Gardner is a 64 y.o. female who presents for the following: Spot (left lower leg) and Dsap vs Aks (bil lower legs, PDT x 2, cholesterol 2%/Lovastatin 2% oint bid). Patient noticed after she had 2 PDT treatments on her leg. Growth is sore and raised.   The following portions of the chart were reviewed this encounter and updated as appropriate:      Review of Systems:  No other skin or systemic complaints except as noted in HPI or Assessment and Plan.  Objective  Well appearing patient in no apparent distress; mood and affect are within normal limits.  A focused examination was performed including left lower leg. Relevant physical exam findings are noted in the Assessment and Plan.  Objective  L medial pretibia: 1.1cm pink firm scaly pap       Objective  R upper forehead: Well healed scar with no evidence of recurrence.   Objective  Left medial lower Leg: Well healed scar with no evidence of recurrence  Objective  bil lower legs: Multiple pink brown scaly macules some with keratotic rims   Assessment & Plan   Actinic Damage - diffuse scaly erythematous macules with underlying dyspigmentation - Recommend daily broad spectrum sunscreen SPF 30+ to sun-exposed areas, reapply every 2 hours as needed.  - Call for new or changing lesions.   Neoplasm of skin L medial pretibia  Epidermal / dermal shaving  Lesion diameter (cm):  1.1 Informed consent: discussed and consent obtained   Patient was prepped and draped in usual sterile fashion: area prepped with alcohol. Anesthesia: the lesion was anesthetized in a standard fashion   Anesthetic:  1% lidocaine w/ epinephrine 1-100,000 buffered w/ 8.4% NaHCO3 Instrument used: flexible razor blade   Hemostasis achieved with: pressure, aluminum chloride and electrodesiccation   Outcome: patient tolerated procedure well   Additional details:  Post txt defect 1.3cm  Destruction of  lesion  Destruction method: electrodesiccation and curettage   Informed consent: discussed and consent obtained   Timeout:  patient name, date of birth, surgical site, and procedure verified Curettage performed in three different directions: Yes   Electrodesiccation performed over the curetted area: Yes   Lesion length (cm):  1.1 Lesion width (cm):  1.1 Margin per side (cm):  0.1 Final wound size (cm):  1.3 Hemostasis achieved with:  pressure, aluminum chloride and electrodesiccation Outcome: patient tolerated procedure well with no complications   Post-procedure details: wound care instructions given   Post-procedure details comment:  Ointment and a bandaid applied  Specimen 1 - Surgical pathology Differential Diagnosis: D48.5 Hypertrophic AK r/o SCC Check Margins: No 1.1cm pink firm scaly pap EDC today  History of basal cell carcinoma (BCC) R upper forehead  Clear. Observe for recurrence. Call clinic for new or changing lesions.  Recommend regular skin exams, daily broad-spectrum spf 30+ sunscreen use, and photoprotection.     History of SCC (squamous cell carcinoma) of skin Left medial lower Leg  Clear. Observe for recurrence. Call clinic for new or changing lesions.  Recommend regular skin exams, daily broad-spectrum spf 30+ sunscreen use, and photoprotection.      DSAP (disseminated superficial actinic porokeratosis) bil lower legs  Vs AKs Some improvement post PDT treatment legs x 2  Cont Cholestrerol 2%/Lovastatin 2% oint bid 3rfs sent to [facility] Cont SPF qd  May consider 5FU/Calcipotriene cr in fall  Return in about 4 months (around 03/30/2020) for f/up DSAP, h/o SCC/BCC.   Murray Hodgkins  Bobby Rumpf, CMA, am acting as scribe for Brendolyn Patty, MD .  I, Othelia Pulling, RMA, am acting as scribe for Brendolyn Patty, MD .  Documentation: I have reviewed the above documentation for accuracy and completeness, and I agree with the above.  Brendolyn Patty MD

## 2019-11-29 NOTE — Patient Instructions (Signed)

## 2019-12-02 ENCOUNTER — Telehealth: Payer: Self-pay

## 2019-12-02 NOTE — Telephone Encounter (Signed)
-----   Message from Brendolyn Patty, MD sent at 12/01/2019  7:14 PM EDT ----- Skin , left medial pretibia ATYPICAL SQUAMOUS PROLIFERATION, EVOLVING SQUAMOUS CELL CARCINOMA NOT EXCLUDED  SCC- already treated with [facility]

## 2019-12-02 NOTE — Telephone Encounter (Signed)
Advised pt of bx results and advised the SCC was txted at time of bx.  Advised pt Dr. Nicole Kindred would recheck area at her f/u appt 04/11/20./sh

## 2020-01-17 ENCOUNTER — Ambulatory Visit: Payer: BC Managed Care – PPO | Admitting: Dermatology

## 2020-02-07 ENCOUNTER — Encounter: Payer: Self-pay | Admitting: Dermatology

## 2020-02-07 ENCOUNTER — Ambulatory Visit: Payer: 59 | Admitting: Dermatology

## 2020-02-07 ENCOUNTER — Other Ambulatory Visit: Payer: Self-pay

## 2020-02-07 ENCOUNTER — Other Ambulatory Visit: Payer: Self-pay | Admitting: Dermatology

## 2020-02-07 DIAGNOSIS — L578 Other skin changes due to chronic exposure to nonionizing radiation: Secondary | ICD-10-CM

## 2020-02-07 DIAGNOSIS — Z85828 Personal history of other malignant neoplasm of skin: Secondary | ICD-10-CM

## 2020-02-07 DIAGNOSIS — L565 Disseminated superficial actinic porokeratosis (DSAP): Secondary | ICD-10-CM | POA: Diagnosis not present

## 2020-02-07 DIAGNOSIS — L570 Actinic keratosis: Secondary | ICD-10-CM | POA: Diagnosis not present

## 2020-02-07 DIAGNOSIS — D485 Neoplasm of uncertain behavior of skin: Secondary | ICD-10-CM

## 2020-02-07 DIAGNOSIS — C44729 Squamous cell carcinoma of skin of left lower limb, including hip: Secondary | ICD-10-CM | POA: Diagnosis not present

## 2020-02-07 NOTE — Progress Notes (Signed)
Follow-Up Visit   Subjective  Jennifer Gardner is a 64 y.o. female who presents for the following: Growth (left lateral lower leg x 1 month) and DSAP (lower legs, using cholesterol/Lovastatin cream. Has had PDT txs.).   The following portions of the chart were reviewed this encounter and updated as appropriate:      Review of Systems:  No other skin or systemic complaints except as noted in HPI or Assessment and Plan.  Objective  Well appearing patient in no apparent distress; mood and affect are within normal limits.  A focused examination was performed including lower legs. Relevant physical exam findings are noted in the Assessment and Plan.  Objective  Left Lateral Lower Leg: 1.3cm pink scaly nodule      Objective  Lower legs: Multiple pink brown scaly macules some with keratotic rims  Objective  Left Medial Lower Leg, Left Medial Pretibia: Well healed scar with no evidence of recurrence.   Objective  Right Lower Leg x 3, Spinal upper back x 1, L medial breast x 1, L lower sternum x 1 (6): Keratotic papules. Pink scaly papule on left medial breast.   Assessment & Plan   Actinic Damage - diffuse scaly erythematous macules with underlying dyspigmentation - Recommend daily broad spectrum sunscreen SPF 30+ to sun-exposed areas, reapply every 2 hours as needed.  - Call for new or changing lesions.   Neoplasm of uncertain behavior of skin Left Lateral Lower Leg  Skin / nail biopsy Type of biopsy: tangential   Informed consent: discussed and consent obtained   Patient was prepped and draped in usual sterile fashion: Area prepped with alcohol. Anesthesia: the lesion was anesthetized in a standard fashion   Anesthetic:  1% lidocaine w/ epinephrine 1-100,000 buffered w/ 8.4% NaHCO3 Instrument used: flexible razor blade   Hemostasis achieved with: pressure and aluminum chloride    Destruction of lesion  Destruction method: electrodesiccation and curettage     Informed consent: discussed and consent obtained   Timeout:  patient name, date of birth, surgical site, and procedure verified Curettage performed in three different directions: Yes   Electrodesiccation performed over the curetted area: Yes   Lesion length (cm):  1.3 Lesion width (cm):  1.3 Margin per side (cm):  0.1 Final wound size (cm):  1.5 Hemostasis achieved with:  pressure, aluminum chloride and electrodesiccation Outcome: patient tolerated procedure well with no complications   Post-procedure details: wound care instructions given   Additional details:  Mupirocin ointment and Bandaid applied    Specimen 1 - Surgical pathology Differential Diagnosis: SCC vs other Check Margins: No 1.3cm pink scaly nodule EDC today  DSAP (disseminated superficial actinic porokeratosis) Lower legs  Continue cholesterol/Lovastatin ointment BID.  May switch to 5FU/Calcipotriene on follow-up. Continue SPF QD.   History of SCC (squamous cell carcinoma) of skin Left Medial Lower Leg, Left Medial Pretibia  Clear. Observe for recurrence. Call clinic for new or changing lesions.  Recommend regular skin exams, daily broad-spectrum spf 30+ sunscreen use, and photoprotection.     AK (actinic keratosis) (6) Right Lower Leg x 3, Spinal upper back x 1, L medial breast x 1, L lower sternum x 1  Recheck left medial breast on follow-up.  Destruction of lesion - Right Lower Leg x 3, Spinal upper back x 1, L medial breast x 1, L lower sternum x 1  Destruction method: cryotherapy   Informed consent: discussed and consent obtained   Lesion destroyed using liquid nitrogen: Yes   Region  frozen until ice ball extended beyond lesion: Yes   Outcome: patient tolerated procedure well with no complications   Post-procedure details: wound care instructions given    Return as scheduled for DSAP, f/u bx.   IJamesetta Orleans, CMA, am acting as scribe for Brendolyn Patty, MD .  Documentation: I have reviewed  the above documentation for accuracy and completeness, and I agree with the above.  Brendolyn Patty MD

## 2020-02-07 NOTE — Patient Instructions (Signed)
Wound Care Instructions ° °1. Cleanse wound gently with soap and water once a day then pat dry with clean gauze. Apply a thing coat of Petrolatum (petroleum jelly, "Vaseline") over the wound (unless you have an allergy to this). We recommend that you use a new, sterile tube of Vaseline. Do not pick or remove scabs. Do not remove the yellow or white "healing tissue" from the base of the wound. ° °2. Cover the wound with fresh, clean, nonstick gauze and secure with paper tape. You may use Band-Aids in place of gauze and tape if the would is small enough, but would recommend trimming much of the tape off as there is often too much. Sometimes Band-Aids can irritate the skin. ° °3. You should call the office for your biopsy report after 1 week if you have not already been contacted. ° °4. If you experience any problems, such as abnormal amounts of bleeding, swelling, significant bruising, significant pain, or evidence of infection, please call the office immediately. ° °Cryotherapy Aftercare ° °• Wash gently with soap and water everyday.   °• Apply Vaseline and Band-Aid daily until healed. ° ° °

## 2020-02-14 ENCOUNTER — Telehealth: Payer: Self-pay

## 2020-02-14 NOTE — Telephone Encounter (Signed)
Advised pt of bx results and advised the SCC was txted with EDC when bx done.  Advised pt to keel f/u appt so Dr. Nicole Kindred could recheck area.Mariana Kaufman

## 2020-02-14 NOTE — Telephone Encounter (Signed)
-----   Message from Brendolyn Patty, MD sent at 02/14/2020  8:31 AM EDT ----- Skin , (A) left lateral lower leg WELL DIFFERENTIATED SQUAMOUS CELL CARCINOMA  SCC- already treated with [facility]

## 2020-04-11 ENCOUNTER — Ambulatory Visit: Payer: BC Managed Care – PPO | Admitting: Dermatology

## 2020-04-11 ENCOUNTER — Other Ambulatory Visit: Payer: Self-pay

## 2020-04-11 DIAGNOSIS — L578 Other skin changes due to chronic exposure to nonionizing radiation: Secondary | ICD-10-CM

## 2020-04-11 DIAGNOSIS — Z85828 Personal history of other malignant neoplasm of skin: Secondary | ICD-10-CM | POA: Diagnosis not present

## 2020-04-11 DIAGNOSIS — L570 Actinic keratosis: Secondary | ICD-10-CM

## 2020-04-11 DIAGNOSIS — Z872 Personal history of diseases of the skin and subcutaneous tissue: Secondary | ICD-10-CM

## 2020-04-11 DIAGNOSIS — L565 Disseminated superficial actinic porokeratosis (DSAP): Secondary | ICD-10-CM

## 2020-04-11 MED ORDER — FLUOROURACIL 5 % EX CREA: TOPICAL_CREAM | CUTANEOUS | 0 refills | Status: AC

## 2020-04-11 MED ORDER — CALCIPOTRIENE 0.005 % EX CREA: TOPICAL_CREAM | CUTANEOUS | 0 refills | Status: AC

## 2020-04-11 NOTE — Progress Notes (Signed)
Follow-Up Visit   Subjective  Jennifer Gardner is a 64 y.o. female who presents for the following: DSAP (B/L lower legs - patient currently using cholesterol/Lovastatin ointment to aa's QD-BID) and AK follow up (recheck for any new or persistent skin lesions).  Pt feels like the Dsap improved some with the PDT, and she may have had a little improvement with the Chol/Lov. Mixture.  Spots that were frozen last time have resolved.  She had a SCC treated on her L leg last visit with EDC. Has healed.  The following portions of the chart were reviewed this encounter and updated as appropriate:     Review of Systems:  No other skin or systemic complaints except as noted in HPI or Assessment and Plan.  Objective  Well appearing patient in no apparent distress; mood and affect are within normal limits.  A focused examination was performed including the trunk and extremities. Relevant physical exam findings are noted in the Assessment and Plan.  Objective  B/L lower leg: Scaly pink/brown macules with keratotic rim scattered over the legs.  Objective  L lat lower leg: Violacious scar   Objective  R lower leg x 3, spinal upper back, L med breast, L lower sternum: Clear   Objective  L ant thigh x 1, L lat knee x 1, R ant thigh x 1 (3): Erythematous thin papules/macules with gritty scale.   Assessment & Plan  Disseminated superficial actinic porokeratosis (DSAP) B/L lower leg  Some improvement noted with past PDT treatment and Chol/Lovastatin ointment, but not at goal. Discussed treatment options of Calcipotriene/5FU cream vs repeat PDT. Start Calcipotriene and 5FU BID x 10-14 days. Discussed will cause redness, crusting, and potential sores.  Patient prefers to wait to start until after her nephew's wedding. D/C Cholesterol/Lovastatin mix while doing treatment and while healing- RF of cream [facility].  Recommend daily broad spectrum sunscreen SPF 30+ to sun-exposed areas,  reapply every 2 hours as needed. Call for new or changing lesions.   calcipotriene (DOVONOX) 0.005 % cream - B/L lower leg  fluorouracil (EFUDEX) 5 % cream - B/L lower leg  History of SCC (squamous cell carcinoma) of skin L lat lower leg  Clear. Observe for recurrence. Call clinic for new or changing lesions.  Recommend regular skin exams, daily broad-spectrum spf 30+ sunscreen use, and photoprotection.     History of actinic keratosis R lower leg x 3, spinal upper back, L med breast, L lower sternum  Clear. Observe for recurrence. Call clinic for new or changing lesions.  Recommend regular skin exams, daily broad-spectrum spf 30+ sunscreen use, and photoprotection.     AK (actinic keratosis) (3) L ant thigh x 1, L lat knee x 1, R ant thigh x 1  Destruction of lesion - L ant thigh x 1, L lat knee x 1, R ant thigh x 1  Destruction method: cryotherapy   Informed consent: discussed and consent obtained   Lesion destroyed using liquid nitrogen: Yes   Region frozen until ice ball extended beyond lesion: Yes   Outcome: patient tolerated procedure well with no complications   Post-procedure details: wound care instructions given     Actinic Damage - diffuse scaly erythematous macules with underlying dyspigmentation - Recommend daily broad spectrum sunscreen SPF 30+ to sun-exposed areas, reapply every 2 hours as needed.  - Call for new or changing lesions.  Return in about 6 months (around 10/10/2020) for DSAP and AK follow up, h/o SCC.  Luther Redo, CMA, am acting as scribe for Brendolyn Patty, MD   Documentation: I have reviewed the above documentation for accuracy and completeness, and I agree with the above.  Brendolyn Patty MD

## 2020-04-20 ENCOUNTER — Other Ambulatory Visit: Payer: Self-pay | Admitting: Neurology

## 2020-07-21 ENCOUNTER — Other Ambulatory Visit: Payer: Self-pay | Admitting: Neurology

## 2020-07-21 NOTE — Telephone Encounter (Signed)
Rx(s) sent to pharmacy electronically.  

## 2020-08-08 ENCOUNTER — Other Ambulatory Visit: Payer: Self-pay | Admitting: Physician Assistant

## 2020-08-08 DIAGNOSIS — Z1231 Encounter for screening mammogram for malignant neoplasm of breast: Secondary | ICD-10-CM

## 2020-08-21 ENCOUNTER — Other Ambulatory Visit: Payer: Self-pay

## 2020-08-21 ENCOUNTER — Ambulatory Visit
Admission: RE | Admit: 2020-08-21 | Discharge: 2020-08-21 | Disposition: A | Discharge status: Home / Self Care | Payer: 59 | Source: Ambulatory Visit | Attending: Physician Assistant | Admitting: Physician Assistant

## 2020-08-21 DIAGNOSIS — Z1231 Encounter for screening mammogram for malignant neoplasm of breast: Secondary | ICD-10-CM | POA: Insufficient documentation

## 2020-08-22 NOTE — Progress Notes (Signed)
Assessment/Plan:    1.  Essential Tremor  Continue topiramate, 100 mg twice per day  -Continue trihexyphenidyl, 2 mg twice per day.  Thus far, she has had no side effects except dry eye.  -Her DaTscan has been negative.  2.  Since patient is doing well and pcp prescribing meds, I am going to discharge her back to the full care of her primary care physician.  Pt can let me know if she needs me in the future  Subjective:   Jennifer Gardner was seen today in follow up for essential tremor.  My previous records were reviewed prior to todays visit. Pt denies falls.  Pt denies lightheadedness, near syncope.  No hallucinations.  Mood has been good.  Patient has lost a significant amount weight over the year through [place].  Pt feeling great  Current prescribed movement disorder medications: Topamax, 100 mg twice daily Artane, 2 mg twice per day (started last visit) - it helped but "i'm dry as a bone."  Prior medications: Primidone  ALLERGIES:   Allergies  Allergen Reactions  . Sulfa Antibiotics Hives and Shortness Of Breath  . Meloxicam     swelling  . Augmentin [Amoxicillin-Pot Clavulanate] Rash    Did it involve swelling of the face/tongue/throat, SOB, or low BP? No Did it involve sudden or severe rash/hives, skin peeling, or any reaction on the inside of your mouth or nose? No  Did you need to seek medical attention at a hospital or doctor's office? No When did it last happen?10 years ago If all above answers are "NO", may proceed with cephalosporin use.   Marland Kitchen Penicillins Hives and Rash    Did it involve swelling of the face/tongue/throat, SOB, or low BP? No Did it involve sudden or severe rash/hives, skin peeling, or any reaction on the inside of your mouth or nose? No  Did you need to seek medical attention at a hospital or doctor's office? No When did it last happen?10 years ago If all above answers are "NO", may proceed with cephalosporin use.       CURRENT MEDICATIONS:  Outpatient Encounter Medications as of 08/24/2020  Medication Sig  . acyclovir (ZOVIRAX) 400 MG tablet Take 1 tablet (400 mg total) by mouth 2 (two) times daily.  . calcipotriene (DOVONOX) 0.005 % cream Apply to the lower legs BID x 10-14 days as directed.  . DULoxetine (CYMBALTA) 30 MG capsule take 1 capsule by mouth once daily (Patient taking differently: Take 30 mg by mouth every evening.)  . estradiol (VIVELLE-DOT) 0.05 MG/24HR patch apply 1 patch TWICE A WEEK (Patient taking differently: Place 1 patch onto the skin 2 (two) times a week. Wednesdays & Sundays.)  . fluorouracil (EFUDEX) 5 % cream Apply to the lower legs BID x 10-14 days as directed.  . progesterone (PROMETRIUM) 100 MG capsule Take 100 mg by mouth at bedtime.  . rosuvastatin (CRESTOR) 20 MG tablet take 1 tablet by mouth once daily (Patient taking differently: Take 20 mg by mouth every evening.)  . topiramate (TOPAMAX) 100 MG tablet Take 1 tablet (100 mg total) by mouth 2 (two) times daily.  Marland Kitchen triamterene-hydrochlorothiazide (MAXZIDE-25) 37.5-25 MG tablet take 1 tablet by mouth once daily  . trihexyphenidyl (ARTANE) 2 MG tablet TAKE 1 TABLET(2 MG) BY MOUTH TWICE DAILY WITH A MEAL  . B Complex-C (SUPER B COMPLEX PO) Take 1 capsule by mouth daily.  (Patient not taking: Reported on 08/24/2020)  . [DISCONTINUED] progesterone (PROMETRIUM) 100 MG  capsule take 1 capsule by mouth daily (Patient not taking: Reported on 08/24/2020)   No facility-administered encounter medications on file as of 08/24/2020.     Objective:    PHYSICAL EXAMINATION:    VITALS:   Vitals:   08/24/20 1105  BP: 106/62  Pulse: 69  SpO2: 98%  Weight: 143 lb (64.9 kg)  Height: 5' 2.5" (1.588 m)    Wt Readings from Last 3 Encounters:  08/24/20 143 lb (64.9 kg)  08/25/19 165 lb (74.8 kg)  03/24/19 165 lb (74.8 kg)     GEN:  The patient appears stated age and is in NAD. HEENT:  Normocephalic, atraumatic.    Neurological  examination:  Orientation: The patient is alert and oriented x3. Cranial nerves: There is good facial symmetry. The speech is fluent and clear. Soft palate rises symmetrically and there is no tongue deviation. Hearing is intact to conversational tone. Sensation: Sensation is intact to light touch throughout Motor: Strength is at least antigravity x4.  Movement examination: Tone: There is normal tone in the UE/LE Abnormal movements: Mild to moderate postural tremor on the left.  Some tremor with archimides spirals on the L Coordination:  There is no decremation with RAM's Gait and Station: The patient has no difficulty arising out of a deep-seated chair without the use of the hands. The patient's stride length is good I have reviewed and interpreted the following labs independently   Chemistry      Component Value Date/Time   NA 140 08/25/2018 1359   K 3.5 08/25/2018 1359   CL 101 08/10/2015 0852   CO2 27 08/10/2015 0852   BUN 19 08/10/2015 0852   CREATININE 0.65 08/10/2015 0852      Component Value Date/Time   CALCIUM 9.4 08/10/2015 0852   ALKPHOS 49 08/10/2015 0852   AST 29 08/10/2015 0852   ALT 34 08/10/2015 0852   BILITOT 0.5 08/10/2015 0852      Lab Results  Component Value Date   WBC 4.3 08/10/2015   HGB 15.0 08/25/2018   HCT 44.0 08/25/2018   MCV 83.4 08/10/2015   PLT 264.0 08/10/2015   Lab Results  Component Value Date   TSH 1.51 08/10/2015     Chemistry      Component Value Date/Time   NA 140 08/25/2018 1359   K 3.5 08/25/2018 1359   CL 101 08/10/2015 0852   CO2 27 08/10/2015 0852   BUN 19 08/10/2015 0852   CREATININE 0.65 08/10/2015 0852      Component Value Date/Time   CALCIUM 9.4 08/10/2015 0852   ALKPHOS 49 08/10/2015 0852   AST 29 08/10/2015 0852   ALT 34 08/10/2015 0852   BILITOT 0.5 08/10/2015 0852     Primary care labs reviewed from December 2.  White blood cells 6.5, hemoglobin 14.2, hematocrit 42.3.  Sodium was 141, potassium 3.5,  chloride 105, CO2 29, BUN 21, creatinine 0.6, AST 25, ALT 37    Total time spent on today's visit was 20 minutes, including both face-to-face time and nonface-to-face time.  Time included that spent on review of records (prior notes available to me/labs/imaging if pertinent), discussing treatment and goals, answering patient's questions and coordinating care.  Cc:  [name], MD

## 2020-08-24 ENCOUNTER — Ambulatory Visit: Payer: 59 | Admitting: Neurology

## 2020-08-24 ENCOUNTER — Other Ambulatory Visit: Payer: Self-pay

## 2020-08-24 ENCOUNTER — Encounter: Payer: Self-pay | Admitting: Neurology

## 2020-08-24 VITALS — BP 106/62 | HR 69 | Ht 62.5 in | Wt 143.0 lb

## 2020-08-24 DIAGNOSIS — G250 Essential tremor: Secondary | ICD-10-CM | POA: Diagnosis not present

## 2020-10-10 ENCOUNTER — Ambulatory Visit (INDEPENDENT_AMBULATORY_CARE_PROVIDER_SITE_OTHER): Payer: 59 | Admitting: Dermatology

## 2020-10-10 ENCOUNTER — Other Ambulatory Visit: Payer: Self-pay

## 2020-10-10 DIAGNOSIS — L570 Actinic keratosis: Secondary | ICD-10-CM

## 2020-10-10 DIAGNOSIS — L565 Disseminated superficial actinic porokeratosis (DSAP): Secondary | ICD-10-CM

## 2020-10-10 DIAGNOSIS — Z85828 Personal history of other malignant neoplasm of skin: Secondary | ICD-10-CM

## 2020-10-10 NOTE — Progress Notes (Signed)
   Follow-Up Visit   Subjective  Jennifer Gardner is a 65 y.o. female who presents for the following: 6 month follow up (Patient here today for follow up on disseminated superficial actinic porokeratosis on bilateral legs. She has had in the past PDT treatment and Chol/Lovastatin ointment. She just recently used Calcipotriene and 5FU BID x 10-14 days. She states areas have became red and crusty. Currently still inflamed today.  She is also here today for follow up on Aks. She reports she has no new areas of concern today at follow up.).   The following portions of the chart were reviewed this encounter and updated as appropriate:       Objective  Well appearing patient in no apparent distress; mood and affect are within normal limits.  A focused examination was performed including bilateral legs, right pretibia, . Relevant physical exam findings are noted in the Assessment and Plan.  Objective  bilateral lower legs: multiple pink and violaceous papules on bilateral legs, some crusted- recent 2 week treatment with 5FU/VitD cream, currently healing  Objective  right lateral leg x 1, left lower leg x 1, left lateral leg x 2, right pretibia x 1 (5): Erythematous thin papules/macules with gritty scale.   Assessment & Plan  Disseminated superficial actinic porokeratosis (DSAP) bilateral lower legs  Chronic condition- healing s/p 5FU/VitD course  restart Chol/Lovastatin cream bid   Recommend Recommend daily broad spectrum sunscreen SPF 30+ to sun-exposed areas, reapply every 2 hours as needed. Call for new or changing lesions.   Also rec staying in the shade or wearing long sleeves, long pants    Other Related Medications calcipotriene (DOVONOX) 0.005 % cream fluorouracil (EFUDEX) 5 % cream  Actinic keratosis (5) right lateral leg x 1, left lower leg x 1, left lateral leg x 2, right pretibia x 1  Hypertrophic   Prior to procedure, discussed risks of blister formation, small  wound, skin dyspigmentation, or rare scar following cryotherapy.    Destruction of lesion - right lateral leg x 1, left lower leg x 1, left lateral leg x 2, right pretibia x 1  Destruction method: cryotherapy   Informed consent: discussed and consent obtained   Lesion destroyed using liquid nitrogen: Yes   Region frozen until ice ball extended beyond lesion: Yes   Outcome: patient tolerated procedure well with no complications   Post-procedure details: wound care instructions given    History of Squamous Cell Carcinoma of the Skin - No evidence of recurrence today L lower leg x 3 - Recommend regular full body skin exams - Recommend daily broad spectrum sunscreen SPF 30+ to sun-exposed areas, reapply every 2 hours as needed.  - Call if any new or changing lesions are noted between office visits  History of Basal Cell Carcinoma of the Skin - No evidence of recurrence today R upper forehead - Recommend regular full body skin exams - Recommend daily broad spectrum sunscreen SPF 30+ to sun-exposed areas, reapply every 2 hours as needed.  - Call if any new or changing lesions are noted between office visits   Return in about 3 months (around 01/09/2021) for tbse and Dsap follow up.  I, Ruthell Rummage, CMA, am acting as scribe for Brendolyn Patty, MD.  Documentation: I have reviewed the above documentation for accuracy and completeness, and I agree with the above.  Brendolyn Patty MD

## 2020-10-10 NOTE — Patient Instructions (Addendum)
Cryotherapy Aftercare  . Wash gently with soap and water everyday.   Marland Kitchen Apply Vaseline and Band-Aid daily until healed.   Recommend daily broad spectrum sunscreen SPF 30+ to sun-exposed areas, reapply every 2 hours as needed. Call for new or changing lesions.  Staying in the shade or wearing long sleeves, sun glasses (UVA+UVB protection) and wide brim hats (4-inch brim around the entire circumference of the hat) are also recommended for sun protection.   Recommend taking Heliocare sun protection supplement daily in sunny weather for additional sun protection. For maximum protection on the sunniest days, you can take up to 2 capsules of regular Heliocare OR take 1 capsule of Heliocare Ultra. For prolonged exposure (such as a full day in the sun), you can repeat your dose of the supplement 4 hours after your first dose. Heliocare can be purchased at [facility] or at [email or web address].    If you have any questions or concerns for your doctor, please call our main line at [phone] and press option 4 to reach your doctor's medical assistant. If no one answers, please leave a voicemail as directed and we will return your call as soon as possible. Messages left after 4 pm will be answered the following business day.   You may also send [place] a message via [place]. We typically respond to MyChart messages within 1-2 business days.  For prescription refills, please ask your pharmacy to contact our office. Our fax number is [phone].  If you have an urgent issue when the clinic is closed that cannot wait until the next business day, you can page your doctor at the number below.    Please note that while we do our best to be available for urgent issues outside of office hours, we are not available 24/7.   If you have an urgent issue and are unable to reach [place], you may choose to seek medical care at your doctor's office, retail clinic, urgent care center, or emergency room.  If you have a  medical emergency, please immediately call 911 or go to the emergency department.  Pager Numbers  - Dr. Nehemiah Massed: [phone]  - Dr. Laurence Ferrari: [phone]  - Dr. Nicole Kindred: [phone]  In the event of inclement weather, please call our main line at [phone] for an update on the status of any delays or closures.  Dermatology Medication Tips: Please keep the boxes that topical medications come in in order to help keep track of the instructions about where and how to use these. Pharmacies typically print the medication instructions only on the boxes and not directly on the medication tubes.   If your medication is too expensive, please contact our office at [phone] option 4 or send [place] a message through [place].   We are unable to tell what your co-pay for medications will be in advance as this is different depending on your insurance coverage. However, we may be able to find a substitute medication at lower cost or fill out paperwork to get insurance to cover a needed medication.   If a prior authorization is required to get your medication covered by your insurance company, please allow [place] 1-2 business days to complete this process.  Drug prices often vary depending on where the prescription is filled and some pharmacies may offer cheaper prices.  The website www.goodrx.com contains coupons for medications through different pharmacies. The prices here do not account for what the cost may be with help from insurance (it may be cheaper with your  insurance), but the website can give you the price if you did not use any insurance.  - You can print the associated coupon and take it with your prescription to the pharmacy.  - You may also stop by our office during regular business hours and pick up a GoodRx coupon card.  - If you need your prescription sent electronically to a different pharmacy, notify our office through [facility] or by phone at [phone] option 4.

## 2020-12-12 ENCOUNTER — Other Ambulatory Visit: Payer: Self-pay | Admitting: Neurology

## 2020-12-13 ENCOUNTER — Other Ambulatory Visit: Payer: Self-pay | Admitting: Neurology

## 2020-12-13 NOTE — Telephone Encounter (Signed)
Pt called in wanting a refill of her Artane. She stated she didn't have  a follow up visit because she has been doing well and did not think she needed to have a follow up scheduled.

## 2020-12-14 MED ORDER — TRIHEXYPHENIDYL HCL 2 MG PO TABS: ORAL_TABLET | ORAL | 1 refills | Status: DC

## 2021-02-13 ENCOUNTER — Ambulatory Visit: Payer: 59 | Admitting: Dermatology

## 2021-02-14 ENCOUNTER — Other Ambulatory Visit: Payer: Self-pay | Admitting: Neurology

## 2021-02-14 DIAGNOSIS — G250 Essential tremor: Secondary | ICD-10-CM

## 2021-03-07 ENCOUNTER — Ambulatory Visit (INDEPENDENT_AMBULATORY_CARE_PROVIDER_SITE_OTHER): Payer: 59 | Admitting: Dermatology

## 2021-03-07 ENCOUNTER — Other Ambulatory Visit: Payer: Self-pay

## 2021-03-07 DIAGNOSIS — Z85828 Personal history of other malignant neoplasm of skin: Secondary | ICD-10-CM | POA: Diagnosis not present

## 2021-03-07 DIAGNOSIS — Z1283 Encounter for screening for malignant neoplasm of skin: Secondary | ICD-10-CM

## 2021-03-07 DIAGNOSIS — L565 Disseminated superficial actinic porokeratosis (DSAP): Secondary | ICD-10-CM

## 2021-03-07 DIAGNOSIS — L821 Other seborrheic keratosis: Secondary | ICD-10-CM

## 2021-03-07 DIAGNOSIS — L814 Other melanin hyperpigmentation: Secondary | ICD-10-CM

## 2021-03-07 DIAGNOSIS — D229 Melanocytic nevi, unspecified: Secondary | ICD-10-CM

## 2021-03-07 DIAGNOSIS — Z86018 Personal history of other benign neoplasm: Secondary | ICD-10-CM | POA: Diagnosis not present

## 2021-03-07 DIAGNOSIS — L570 Actinic keratosis: Secondary | ICD-10-CM | POA: Diagnosis not present

## 2021-03-07 DIAGNOSIS — L309 Dermatitis, unspecified: Secondary | ICD-10-CM

## 2021-03-07 DIAGNOSIS — L578 Other skin changes due to chronic exposure to nonionizing radiation: Secondary | ICD-10-CM

## 2021-03-07 DIAGNOSIS — L738 Other specified follicular disorders: Secondary | ICD-10-CM

## 2021-03-07 DIAGNOSIS — D1800 Hemangioma unspecified site: Secondary | ICD-10-CM

## 2021-03-07 DIAGNOSIS — D225 Melanocytic nevi of trunk: Secondary | ICD-10-CM

## 2021-03-07 NOTE — Patient Instructions (Signed)
Cryotherapy  Cryotherapy is the treatment of lesions with the application of a cold substance.  In most cases, liquid nitrogen is used to destroy the lesion(s).  Liquid nitrogen is so cold, -196 Celsius, it feels like it is burning when it is applied.  After treatment with liquid nitrogen, there may be some burning sensation or pain that can last up to 24 hours.  The area may also be swollen and red.  The discomfort can be relieved with ibuprofen (Advil, Motrin), acetaminophen (Extra Strength Tylenol), or similar pain relief medication.  Within 24 to 48 hours, a blister may form.  Occasionally, these blisters will become filled with blood and become very dark.  This is no cause for concern.  The blister will gradually dry up over a period of several days, eventually separating from the healing skin below in about one to two weeks.  The surrounding skin will become red and the area may become itchy.  This is all part of the normal healing process.  Occasionally, the crusts will last as long as four weeks when certain deeper spots on the skin are treated.  Sometimes a permanent white mark or scar will be left after healing.  You may continue all of your normal activities as long as they do not cause pain in the treated areas.  It is okay to get the area wet.  After therapy or if the blister is still intact - You may treat it like normal skin.  Covering the area with a bandage may offer some comfort and protection from trauma but is not absolutely necessary.  If the blister is uncomfortable - Clean a small needle with rubbing alcohol, then gently make a small hole in the side of the blister to drain the blister fluid.  This often gives immediate relief.  Do not remove the blister roof, as the blister aids in healing.  In time it will fall off on its own.   Once the blister roof falls off or a sore forms -  Clean the blister sites with soap and water.  Rinse the area and pat dry.  Do not force off the  blister roof or crust. Apply an antibiotic ointment such as Polysporin or Bacitracin. A bandage may be applied loosely over the blister until it is healed if desired. Call our office if you are concerned it may be infected.  Some redness, itching and oozing is part of the normal healing process.  Signs of infection include increasing redness, increasing pain, swelling, heat, or yellow discharge.  If you have any questions or concerns for your doctor, please call our main line at [phone] and press option 4 to reach your doctor's medical assistant. If no one answers, please leave a voicemail as directed and we will return your call as soon as possible. Messages left after 4 pm will be answered the following business day.   You may also send [place] a message via [place]. We typically respond to MyChart messages within 1-2 business days.  For prescription refills, please ask your pharmacy to contact our office. Our fax number is [phone].  If you have an urgent issue when the clinic is closed that cannot wait until the next business day, you can page your doctor at the number below.    Please note that while we do our best to be available for urgent issues outside of office hours, we are not available 24/7.   If you have an urgent issue and are unable  to reach [place], you may choose to seek medical care at your doctor's office, retail clinic, urgent care center, or emergency room.  If you have a medical emergency, please immediately call 911 or go to the emergency department.  Pager Numbers  - Dr. Nehemiah Massed: [phone]  - Dr. Laurence Ferrari: [phone]  - Dr. Nicole Kindred: [phone]  In the event of inclement weather, please call our main line at [phone] for an update on the status of any delays or closures.  Dermatology Medication Tips: Please keep the boxes that topical medications come in in order to help keep track of the instructions about where and how to use these. Pharmacies typically  print the medication instructions only on the boxes and not directly on the medication tubes.   If your medication is too expensive, please contact our office at [phone] option 4 or send [place] a message through [place].   We are unable to tell what your co-pay for medications will be in advance as this is different depending on your insurance coverage. However, we may be able to find a substitute medication at lower cost or fill out paperwork to get insurance to cover a needed medication.   If a prior authorization is required to get your medication covered by your insurance company, please allow [place] 1-2 business days to complete this process.  Drug prices often vary depending on where the prescription is filled and some pharmacies may offer cheaper prices.  The website www.goodrx.com contains coupons for medications through different pharmacies. The prices here do not account for what the cost may be with help from insurance (it may be cheaper with your insurance), but the website can give you the price if you did not use any insurance.  - You can print the associated coupon and take it with your prescription to the pharmacy.  - You may also stop by our office during regular business hours and pick up a GoodRx coupon card.  - If you need your prescription sent electronically to a different pharmacy, notify our office through [facility] or by phone at [phone] option 4.

## 2021-03-07 NOTE — Progress Notes (Addendum)
Follow-Up Visit   Subjective  Jennifer Gardner is a 65 y.o. female who presents for the following: Annual Exam (HxSCC, HxBCC. HxDN. No particular areas of concern. Stable.) and Follow-up (Patient here today for follow up on disseminated superficial actinic porokeratosis on bilateral legs. She has had in the past PDT treatment, Chol/Lovastatin ointment and Calcipotriene and 5FU BID.).    The following portions of the chart were reviewed this encounter and updated as appropriate:      Review of Systems: No other skin or systemic complaints except as noted in HPI or Assessment and Plan.   Objective  Well appearing patient in no apparent distress; mood and affect are within normal limits.  A full examination was performed including scalp, head, eyes, ears, nose, lips, neck, chest, axillae, abdomen, back, buttocks, bilateral upper extremities, bilateral lower extremities, hands, feet, fingers, toes, fingernails, and toenails. All findings within normal limits unless otherwise noted below.  B/L lower legs multiple light pink and violaceous/hyperpigmented thin papules mild scale on bilateral legs  right medial calf x1, left upper pretibia x4, right hand dorsum x1, left upper arm x2, left posterior arm x1, right upper back x1, right cheek x1, left cheek x2 (13) Erythematous thin papules/macules with gritty scale.   Mid Back, buttocks Scattered brown, medium-dark brown macules scattered at back and buttocks.         right jaw/chin Yellowish papules  Assessment & Plan   Lentigines - Scattered tan macules - Due to sun exposure - Benign-appearing, observe - Recommend daily broad spectrum sunscreen SPF 30+ to sun-exposed areas, reapply every 2 hours as needed. - Call for any changes  Seborrheic Keratoses - Stuck-on, waxy, tan-brown papules and/or plaques on back - Benign-appearing - Discussed benign etiology and prognosis. - Observe - Call for any changes  Melanocytic Nevi -  Tan-brown and/or pink-flesh-colored symmetric macules and papules on back - Benign appearing on exam today - Observation - Call clinic for new or changing moles - Recommend daily use of broad spectrum spf 30+ sunscreen to sun-exposed areas.   Hemangiomas - Red papules - Discussed benign nature - Observe - Call for any changes  Actinic Damage - Severe, confluent actinic changes with pre-cancerous actinic keratoses legs - Severe, chronic, not at goal, secondary to cumulative UV radiation exposure over time - diffuse scaly erythematous macules and papules with underlying dyspigmentation - Discussed Prescription "Field Treatment" for Severe, Chronic Confluent Actinic Changes with Pre-Cancerous Actinic Keratoses Field treatment involves treatment of an entire area of skin that has confluent Actinic Changes (Sun/ Ultraviolet light damage) and PreCancerous Actinic Keratoses by method of PhotoDynamic Therapy (PDT) and/or prescription Topical Chemotherapy agents such as 5-fluorouracil, 5-fluorouracil/calcipotriene, and/or imiquimod.  The purpose is to decrease the number of clinically evident and subclinical PreCancerous lesions to prevent progression to development of skin cancer by chemically destroying early precancer changes that may or may not be visible.  It has been shown to reduce the risk of developing skin cancer in the treated area. As a result of treatment, redness, scaling, crusting, and open sores may occur during treatment course. One or more than one of these methods may be used and may have to be used several times to control, suppress and eliminate the PreCancerous changes. Discussed treatment course, expected reaction, and possible side effects. - Recommend daily broad spectrum sunscreen SPF 30+ to sun-exposed areas, reapply every 2 hours as needed.  - Staying in the shade or wearing long sleeves, sun glasses (UVA+UVB protection) and wide  brim hats (4-inch brim around the entire  circumference of the hat) are also recommended. - Call for new or changing lesions.  - Plan PDT for legs x 2 after holidays.  Skin cancer screening performed today.  History of Basal Cell Carcinoma of the Skin - No evidence of recurrence today at right upper forehead - Recommend regular full body skin exams - Recommend daily broad spectrum sunscreen SPF 30+ to sun-exposed areas, reapply every 2 hours as needed.  - Call if any new or changing lesions are noted between office visits   History of Squamous Cell Carcinoma of the Skin - No evidence of recurrence today at left lower leg (x3) - Recommend regular full body skin exams - Recommend daily broad spectrum sunscreen SPF 30+ to sun-exposed areas, reapply every 2 hours as needed.  - Call if any new or changing lesions are noted between office visits  History of Dysplastic Nevus - No evidence of recurrence today at mid back - Recommend regular full body skin exams - Recommend daily broad spectrum sunscreen SPF 30+ to sun-exposed areas, reapply every 2 hours as needed.  - Call if any new or changing lesions are noted between office visits   Dermatitis, disseminated superficial actinic porokeratosis (DSAP) B/L lower legs  Improving post PDT x 2 treatments and topical treatment  Chronic inherited condition of sun-exposed skin, most commonly arms and legs.  Difficult to treat.  Recommend photoprotection and regular use of spf 30 or higher sunscreen to prevent worsening of condition and precancerous changes.  continue Cholesterol 2% Lovastatin 2% Cream 240 gm- Apply twice daily as directed to affected areas arms and legs.  Rfs sent to [facility]   AK (actinic keratosis) (13) right medial calf x1, left upper pretibia x4, right hand dorsum x1, left upper arm x2, left posterior arm x1, right upper back x1, right cheek x1, left cheek x2  Actinic keratoses are precancerous spots that appear secondary to cumulative UV radiation  exposure/sun exposure over time. They are chronic with expected duration over 1 year. A portion of actinic keratoses will progress to squamous cell carcinoma of the skin. It is not possible to reliably predict which spots will progress to skin cancer and so treatment is recommended to prevent development of skin cancer.  Recommend daily broad spectrum sunscreen SPF 30+ to sun-exposed areas, reapply every 2 hours as needed.  Recommend staying in the shade or wearing long sleeves, sun glasses (UVA+UVB protection) and wide brim hats (4-inch brim around the entire circumference of the hat). Call for new or changing lesions.  Plan PDT for legs x 2 after holidays.  Destruction of lesion - right medial calf x1, left upper pretibia x4, right hand dorsum x1, left upper arm x2, left posterior arm x1, right upper back x1, right cheek x1, left cheek x2  Destruction method: cryotherapy   Informed consent: discussed and consent obtained   Lesion destroyed using liquid nitrogen: Yes   Region frozen until ice ball extended beyond lesion: Yes   Outcome: patient tolerated procedure well with no complications   Post-procedure details: wound care instructions given   Additional details:  Prior to procedure, discussed risks of blister formation, small wound, skin dyspigmentation, or rare scar following cryotherapy. Recommend Vaseline ointment to treated areas while healing.   Melanocytic nevi of trunk Mid Back, buttocks  Benign-appearing.  Observation.  Call clinic for new or changing lesions.  Recommend daily use of broad spectrum spf 30+ sunscreen to sun-exposed areas.  Pt  has h/o tanning bed use    Sebaceous hyperplasia right jaw/chin  Discussed ED to lesions. Not covered by insurance. Pt may schedule  Continue tretinoin cream qhs as tolerated  Discussed cosmetic procedure (ED), noncovered.  $60 for 1st lesion and $15 for each additional lesion if done on the same day.  Maximum charge $350.  One  touch-up treatment included no charge. Discussed risks of treatment including dyspigmentation, small scar, and/or recurrence. Recommend daily broad spectrum sunscreen SPF 30+/photoprotection to treated areas once healed.     Return for PDT to legs after holidays. 6 months Recheck DSAP/AKs.  I, Emelia Salisbury, CMA, am acting as scribe for Brendolyn Patty, MD.  Documentation: I have reviewed the above documentation for accuracy and completeness, and I agree with the above.  Brendolyn Patty MD

## 2021-04-05 ENCOUNTER — Other Ambulatory Visit: Payer: Self-pay | Admitting: Urology

## 2021-04-05 DIAGNOSIS — R109 Unspecified abdominal pain: Secondary | ICD-10-CM

## 2021-04-05 DIAGNOSIS — N200 Calculus of kidney: Secondary | ICD-10-CM

## 2021-04-24 ENCOUNTER — Ambulatory Visit
Admission: RE | Admit: 2021-04-24 | Discharge: 2021-04-24 | Disposition: A | Discharge status: Home / Self Care | Payer: 59 | Source: Ambulatory Visit | Attending: Urology | Admitting: Urology

## 2021-04-24 ENCOUNTER — Other Ambulatory Visit: Payer: Self-pay

## 2021-04-24 DIAGNOSIS — R109 Unspecified abdominal pain: Secondary | ICD-10-CM | POA: Insufficient documentation

## 2021-04-24 DIAGNOSIS — N200 Calculus of kidney: Secondary | ICD-10-CM | POA: Diagnosis present

## 2021-04-24 LAB — POCT I-STAT CREATININE: Creatinine, Ser: 0.8 mg/dL (ref 0.44–1.00)

## 2021-04-24 MED ORDER — IOHEXOL 300 MG/ML  SOLN
100.0000 mL | Freq: Once | INTRAMUSCULAR | Status: AC | PRN
  Administered 2021-04-24: 100 mL via INTRAVENOUS

## 2021-05-16 ENCOUNTER — Other Ambulatory Visit: Payer: Self-pay | Admitting: Physician Assistant

## 2021-05-16 DIAGNOSIS — R1011 Right upper quadrant pain: Secondary | ICD-10-CM

## 2021-05-16 DIAGNOSIS — R109 Unspecified abdominal pain: Secondary | ICD-10-CM

## 2021-05-30 ENCOUNTER — Ambulatory Visit
Admission: RE | Admit: 2021-05-30 | Discharge: 2021-05-30 | Disposition: A | Discharge status: Home / Self Care | Payer: 59 | Source: Ambulatory Visit | Attending: Physician Assistant | Admitting: Physician Assistant

## 2021-05-30 DIAGNOSIS — R109 Unspecified abdominal pain: Secondary | ICD-10-CM | POA: Diagnosis present

## 2021-05-30 DIAGNOSIS — R1011 Right upper quadrant pain: Secondary | ICD-10-CM | POA: Diagnosis present

## 2021-05-30 MED ORDER — TECHNETIUM TC 99M MEBROFENIN IV KIT
5.0600 | PACK | Freq: Once | INTRAVENOUS | Status: AC | PRN
  Administered 2021-05-30: 09:00:00 5.06 via INTRAVENOUS

## 2021-06-07 ENCOUNTER — Encounter: Payer: Self-pay | Admitting: Gastroenterology

## 2021-06-07 NOTE — H&P (Signed)
Pre-Procedure H&P   Patient ID: Jennifer Gardner is a 65 y.o. female.  Gastroenterology Provider: [name], DO  Referring Provider: Paulita Cradle, NP PCP:  Paulita Cradle, NP  Date: 06/08/2021  HPI Ms. Jennifer Gardner is a 65 y.o. female who presents today for Colonoscopy for personal history of colon polyps.  Patient underwent colonoscopy in April 2019 with multiple adenomas.  Approximately 4 of the 8 polyps taken were adenomatous.  She has no family history of colorectal cancer or colon polyps.  Hemoglobin is 15.2 MCV 89 platelets 203,000.  Has intentionally lost 35 pounds.    No melena, hematochezia. Bowels moving regularly  No other acute GI complaints   Past Medical History:  Diagnosis Date   Actinic keratosis    Arthritis    toes, fingers   Basal cell carcinoma 07/23/2019   right upper forehead, pigmented. Excised: 08/16/2019, margins free.   Complication of anesthesia    during colooscopy "got strangled" and they had to "put a tube in my throat"   Depression    after death of father 2004/09/21   Dysplastic nevus 04/23/2007   mid back, slight atypia, base involved.   H/O benign essential tremor    History of kidney stones    Hyperlipidemia    Hypertension    PT TOOK HERSELF OFF OF HER MAXZIDE 2 WEEKS AGO (APP. FEB 2020) DUE TO DEHYDRATION AND PT STATES BP HAS BEEN NORMAL   Shingles    back - none recently   Squamous cell carcinoma of skin 07/23/2019   left medial lower leg. WD SCC   Squamous cell carcinoma of skin 11/29/2019   Left medial pretibia. Atypical squamous proliferation, evolving SCC not excluded.    Squamous cell carcinoma of skin 02/07/2020   Left lateral lower leg. WD SCC. EDC.   Wears contact lenses    sometimes    Past Surgical History:  Procedure Laterality Date   ARTHRODESIS METATARSALPHALANGEAL JOINT (MTPJ) Right 07/09/2018   Procedure: ARTHRODESIS;HALLUS/IP JOINT;  Surgeon: Albertine Patricia, DPM;  Location: [facility];   Service: Podiatry;  Laterality: Right;  PARAGON MONSTER LMA LOCAL   BREAST SURGERY     breast reduction   COLONOSCOPY  09/21/17   CYSTOSCOPY W/ RETROGRADES N/A 08/25/2018   Procedure: CYSTOSCOPY WITH RETROGRADE PYELOGRAM;  Surgeon: Royston Cowper, MD;  Location: [facility];  Service: Urology;  Laterality: N/A;   FACIAL COSMETIC SURGERY  06/2014   FOOT SURGERY Bilateral 06/2013   Dr. Elvina Mattes   HAMMER TOE SURGERY Left 01/26/2015   Procedure: HAMMER TOE CORRECTION;  Surgeon: Albertine Patricia, DPM;  Location: [place];  Service: Podiatry;  Laterality: Left;  LMA WITH LOCAL   REDUCTION MAMMAPLASTY Bilateral 09/22/2006   URETEROSCOPY WITH HOLMIUM LASER LITHOTRIPSY Left 08/25/2018   Procedure: LEFT URETEROSCOPY WITH HOLMIUM LASER AND LEFT STENT PLACEMENT;  Surgeon: Royston Cowper, MD;  Location: [facility];  Service: Urology;  Laterality: Left;   WEIL OSTEOTOMY Left 01/26/2015   Procedure: WEIL OSTEOTOMY 2ND;  Surgeon: Albertine Patricia, DPM;  Location: [facility];  Service: Podiatry;  Laterality: Left;  IVA WITH LOCAL    Family History No h/o GI disease or malignancy  Review of Systems  Constitutional:  Negative for activity change, appetite change, chills, diaphoresis, fatigue, fever and unexpected weight change.  HENT:  Negative for trouble swallowing and voice change.   Respiratory:  Negative for shortness of breath and wheezing.   Cardiovascular:  Negative for chest pain, palpitations and  leg swelling.  Gastrointestinal:  Negative for abdominal distention, abdominal pain, anal bleeding, blood in stool, constipation, diarrhea, nausea, rectal pain and vomiting.  Musculoskeletal:  Positive for back pain (Right sided). Negative for arthralgias and myalgias.  Skin:  Negative for color change and pallor.  Neurological:  Negative for dizziness, syncope and weakness.  Psychiatric/Behavioral:  Negative for confusion.   All other systems reviewed and are negative.   Medications No  current facility-administered medications on file prior to encounter.   Current Outpatient Medications on File Prior to Encounter  Medication Sig Dispense Refill   acyclovir (ZOVIRAX) 400 MG tablet Take 1 tablet (400 mg total) by mouth 2 (two) times daily. 180 tablet 1   DULoxetine (CYMBALTA) 30 MG capsule take 1 capsule by mouth once daily (Patient taking differently: Take 30 mg by mouth every evening.) 90 capsule 3   estradiol (VIVELLE-DOT) 0.05 MG/24HR patch apply 1 patch TWICE A WEEK (Patient taking differently: Place 1 patch onto the skin 2 (two) times a week. Wednesdays & Sundays.) 16 patch 6   rosuvastatin (CRESTOR) 20 MG tablet take 1 tablet by mouth once daily (Patient taking differently: Take 20 mg by mouth every evening.) 90 tablet 0   topiramate (TOPAMAX) 100 MG tablet Take 1 tablet (100 mg total) by mouth 2 (two) times daily. 180 tablet 1   triamterene-hydrochlorothiazide (MAXZIDE-25) 37.5-25 MG tablet take 1 tablet by mouth once daily 90 tablet 0   trihexyphenidyl (ARTANE) 2 MG tablet TAKE 1 TABLET(2 MG) BY MOUTH TWICE DAILY WITH A MEAL 120 tablet 0   B Complex-C (SUPER B COMPLEX PO) Take 1 capsule by mouth daily.  (Patient not taking: No sig reported)     calcipotriene (DOVONOX) 0.005 % cream Apply to the lower legs BID x 10-14 days as directed. 60 g 0   fluorouracil (EFUDEX) 5 % cream Apply to the lower legs BID x 10-14 days as directed. 40 g 0   progesterone (PROMETRIUM) 100 MG capsule Take 100 mg by mouth at bedtime.      Pertinent medications related to GI and procedure were reviewed by me with the patient prior to the procedure   Current Facility-Administered Medications:    0.9 %  sodium chloride infusion, , Intravenous, Continuous, [name], DO, Last Rate: 20 mL/hr at 06/08/21 0756, New Bag at 06/08/21 0756      Allergies  Allergen Reactions   Sulfa Antibiotics Hives and Shortness Of Breath   Meloxicam     swelling   Augmentin [Amoxicillin-Pot  Clavulanate] Rash    Did it involve swelling of the face/tongue/throat, SOB, or low BP? No Did it involve sudden or severe rash/hives, skin peeling, or any reaction on the inside of your mouth or nose? No  Did you need to seek medical attention at a hospital or doctor's office? No When did it last happen?10 years ago      If all above answers are NO, may proceed with cephalosporin use.    Penicillins Hives and Rash    Did it involve swelling of the face/tongue/throat, SOB, or low BP? No Did it involve sudden or severe rash/hives, skin peeling, or any reaction on the inside of your mouth or nose? No  Did you need to seek medical attention at a hospital or doctor's office? No When did it last happen?10 years ago      If all above answers are "NO", may proceed with cephalosporin use.     Allergies were reviewed by me prior  to the procedure  Objective    Vitals:   06/08/21 0743  BP: 119/64  Pulse: 75  Resp: 16  Temp: (!) 97.4 F (36.3 C)  TempSrc: Temporal  SpO2: 97%  Weight: 69.4 kg  Height: 5\' 3"  (1.6 m)     Physical Exam Vitals and nursing note reviewed.  Constitutional:      General: She is not in acute distress.    Appearance: Normal appearance. She is not ill-appearing, toxic-appearing or diaphoretic.  HENT:     Head: Normocephalic and atraumatic.     Nose: Nose normal.     Mouth/Throat:     Mouth: Mucous membranes are moist.     Pharynx: Oropharynx is clear.  Eyes:     General: No scleral icterus.    Extraocular Movements: Extraocular movements intact.  Cardiovascular:     Rate and Rhythm: Normal rate and regular rhythm.     Heart sounds: Normal heart sounds. No murmur heard.   No friction rub. No gallop.  Pulmonary:     Effort: Pulmonary effort is normal. No respiratory distress.     Breath sounds: Normal breath sounds. No wheezing, rhonchi or rales.  Abdominal:     General: Bowel sounds are normal. There is no distension.     Palpations: Abdomen is  soft.     Tenderness: There is no abdominal tenderness. There is no guarding or rebound.  Musculoskeletal:     Cervical back: Neck supple.     Right lower leg: No edema.     Left lower leg: No edema.  Skin:    General: Skin is warm and dry.     Coloration: Skin is not jaundiced or pale.  Neurological:     General: No focal deficit present.     Mental Status: She is alert and oriented to person, place, and time. Mental status is at baseline.  Psychiatric:        Mood and Affect: Mood normal.        Behavior: Behavior normal.        Thought Content: Thought content normal.        Judgment: Judgment normal.     Assessment:  Ms. ADALY Gardner is a 65 y.o. female  who presents today for Colonoscopy for personal history of colon polyps..  Plan:  Colonoscopy with possible intervention today  Colonoscopy with possible biopsy, control of bleeding, polypectomy, and interventions as necessary has been discussed with the patient/patient representative. Informed consent was obtained from the patient/patient representative after explaining the indication, nature, and risks of the procedure including but not limited to death, bleeding, perforation, missed neoplasm/lesions, cardiorespiratory compromise, and reaction to medications. Opportunity for questions was given and appropriate answers were provided. Patient/patient representative has verbalized understanding is amenable to undergoing the procedure.   [name], DO  [facility]  Portions of the record may have been created with voice recognition software. Occasional wrong-word or 'sound-a-like' substitutions may have occurred due to the inherent limitations of voice recognition software.  Read the chart carefully and recognize, using context, where substitutions may have occurred.

## 2021-06-08 ENCOUNTER — Ambulatory Visit: Payer: 59 | Admitting: Certified Registered Nurse Anesthetist

## 2021-06-08 ENCOUNTER — Encounter: Payer: Self-pay | Admitting: Gastroenterology

## 2021-06-08 ENCOUNTER — Other Ambulatory Visit: Payer: Self-pay

## 2021-06-08 ENCOUNTER — Ambulatory Visit
Admission: RE | Admit: 2021-06-08 | Discharge: 2021-06-08 | Disposition: A | Discharge status: Home / Self Care | Payer: 59 | Attending: Gastroenterology | Admitting: Gastroenterology

## 2021-06-08 ENCOUNTER — Encounter
Admission: RE | Disposition: A | Discharge status: Home / Self Care | Payer: Self-pay | Source: Home / Self Care | Attending: Gastroenterology

## 2021-06-08 DIAGNOSIS — Z86010 Personal history of colonic polyps: Secondary | ICD-10-CM | POA: Insufficient documentation

## 2021-06-08 DIAGNOSIS — Z79899 Other long term (current) drug therapy: Secondary | ICD-10-CM | POA: Diagnosis not present

## 2021-06-08 DIAGNOSIS — E785 Hyperlipidemia, unspecified: Secondary | ICD-10-CM | POA: Diagnosis not present

## 2021-06-08 DIAGNOSIS — D125 Benign neoplasm of sigmoid colon: Secondary | ICD-10-CM | POA: Insufficient documentation

## 2021-06-08 DIAGNOSIS — I10 Essential (primary) hypertension: Secondary | ICD-10-CM | POA: Insufficient documentation

## 2021-06-08 DIAGNOSIS — K644 Residual hemorrhoidal skin tags: Secondary | ICD-10-CM | POA: Diagnosis not present

## 2021-06-08 HISTORY — PX: COLONOSCOPY: SHX5424

## 2021-06-08 HISTORY — DX: Personal history of urinary calculi: Z87.442

## 2021-06-08 SURGERY — COLONOSCOPY
Anesthesia: General

## 2021-06-08 MED ORDER — PROPOFOL 500 MG/50ML IV EMUL
INTRAVENOUS | Status: DC | PRN
  Administered 2021-06-08: 100 ug/kg/min via INTRAVENOUS

## 2021-06-08 MED ORDER — LIDOCAINE HCL (CARDIAC) PF 100 MG/5ML IV SOSY
PREFILLED_SYRINGE | INTRAVENOUS | Status: DC | PRN
  Administered 2021-06-08: 70 mg via INTRAVENOUS

## 2021-06-08 MED ORDER — PROPOFOL 10 MG/ML IV BOLUS
INTRAVENOUS | Status: AC
  Filled 2021-06-08: qty 80

## 2021-06-08 MED ORDER — PROPOFOL 500 MG/50ML IV EMUL
INTRAVENOUS | Status: AC
  Filled 2021-06-08: qty 50

## 2021-06-08 MED ORDER — SODIUM CHLORIDE 0.9 % IV SOLN: INTRAVENOUS | Status: DC

## 2021-06-08 MED ORDER — PROPOFOL 10 MG/ML IV BOLUS
INTRAVENOUS | Status: DC | PRN
  Administered 2021-06-08: 70 mg via INTRAVENOUS

## 2021-06-08 NOTE — Interval H&P Note (Signed)
History and Physical Interval Note: Preprocedure H&P from 06/08/21  was reviewed and there was no interval change after seeing and examining the patient.  Written consent was obtained from the patient after discussion of risks, benefits, and alternatives. Patient has consented to proceed with Colonoscopy with possible intervention   06/08/2021 8:20 AM  Jennifer Gardner  has presented today for surgery, with the diagnosis of (Z12.11) COLON CANCER SCREENING..  The various methods of treatment have been discussed with the patient and family. After consideration of risks, benefits and other options for treatment, the patient has consented to  Procedure(s): COLONOSCOPY (N/A) as a surgical intervention.  The patient's history has been reviewed, patient examined, no change in status, stable for surgery.  I have reviewed the patient's chart and labs.  Questions were answered to the patient's satisfaction.     [name]

## 2021-06-08 NOTE — Op Note (Signed)
[facility] Patient Name: Jennifer Gardner Procedure Date: 06/08/2021 8:12 AM MRN: [id number] Account #: [id number] Date of Birth: 27-Nov-1955 Admit Type: Outpatient Age: 65 Room: [facility] ENDO ROOM 3 Gender: Female Note Status: Finalized Instrument Name: Colonoscope [id number] Procedure:             Colonoscopy Indications:           High risk colon cancer surveillance: Personal history                         of colonic polyps Providers:             [name] DO, DO Medicines:             Monitored Anesthesia Care Complications:         No immediate complications. Estimated blood loss:                         Minimal. Procedure:             Pre-Anesthesia Assessment:                        - Prior to the procedure, a History and Physical was                         performed, and patient medications and allergies were                         reviewed. The patient is competent. The risks and                         benefits of the procedure and the sedation options and                         risks were discussed with the patient. All questions                         were answered and informed consent was obtained.                         Patient identification and proposed procedure were                         verified by the physician, the nurse, the anesthetist                         and the technician in the endoscopy suite. Mental                         Status Examination: alert and oriented. Airway                         Examination: normal oropharyngeal airway and neck                         mobility. Respiratory Examination: clear to                         auscultation. CV Examination: RRR, no murmurs, no S3  or S4. Prophylactic Antibiotics: The patient does not                         require prophylactic antibiotics. Prior                         Anticoagulants: The patient has taken no previous                          anticoagulant or antiplatelet agents. ASA Grade                         Assessment: II - A patient with mild systemic disease.                         After reviewing the risks and benefits, the patient                         was deemed in satisfactory condition to undergo the                         procedure. The anesthesia plan was to use monitored                         anesthesia care (MAC). Immediately prior to                         administration of medications, the patient was                         re-assessed for adequacy to receive sedatives. The                         heart rate, respiratory rate, oxygen saturations,                         blood pressure, adequacy of pulmonary ventilation, and                         response to care were monitored throughout the                         procedure. The physical status of the patient was                         re-assessed after the procedure.                        After obtaining informed consent, the colonoscope was                         passed under direct vision. Throughout the procedure,                         the patient's blood pressure, pulse, and oxygen                         saturations were monitored continuously. The  Colonoscope was introduced through the anus and                         advanced to the the cecum, identified by appendiceal                         orifice and ileocecal valve. The colonoscopy was                         performed without difficulty. The patient tolerated                         the procedure well. The quality of the bowel                         preparation was evaluated using the BBPS [facility] Bowel                         Preparation Scale) with scores of: Right Colon = 3,                         Transverse Colon = 3 and Left Colon = 3 (entire mucosa                         seen well with no residual staining, small fragments                          of stool or opaque liquid). The total BBPS score                         equals 9. The ileocecal valve, appendiceal orifice,                         and rectum were photographed. Findings:      Skin tags were found on perianal exam.      The digital rectal exam was normal. Pertinent negatives include normal       sphincter tone.      A 1 to 2 mm polyp was found in the ascending colon. The polyp was       sessile. The polyp was removed with a cold biopsy forceps. Resection and       retrieval were complete. Estimated blood loss was minimal.      Three sessile polyps were found in the sigmoid colon. The polyps were 1       to 2 mm in size. These polyps were removed with a cold biopsy forceps.       Resection and retrieval were complete. Estimated blood loss was minimal.      A 4 to 5 mm polyp was found in the sigmoid colon. The polyp was sessile.       The polyp was removed with a cold snare. Resection and retrieval were       complete. Estimated blood loss was minimal.      The exam was otherwise without abnormality on direct and retroflexion       views. Impression:            - Perianal skin tags found on perianal exam.                        -  One 1 to 2 mm polyp in the ascending colon, removed                         with a cold biopsy forceps. Resected and retrieved.                        - Three 1 to 2 mm polyps in the sigmoid colon, removed                         with a cold biopsy forceps. Resected and retrieved.                        - One 4 to 5 mm polyp in the sigmoid colon, removed                         with a cold snare. Resected and retrieved.                        - The examination was otherwise normal on direct and                         retroflexion views. Recommendation:        - Discharge patient to home.                        - Resume previous diet.                        - Continue present medications.                        - Await pathology  results.                        - Repeat colonoscopy for surveillance based on                         pathology results.                        - Return to referring physician as previously                         scheduled. Procedure Code(s):     --- Professional ---                        [phone], Colonoscopy, flexible; with removal of                         tumor(s), polyp(s), or other lesion(s) by snare                         technique                        56213, 47, Colonoscopy, flexible; with biopsy, single                         or multiple Diagnosis Code(s):     --- Professional ---  K63.5, Polyp of colon                        Z86.010, Personal history of colonic polyps                        K64.4, Residual hemorrhoidal skin tags CPT copyright 2019 American [employer] Association. All rights reserved. The codes documented in this report are preliminary and upon coder review may  be revised to meet current compliance requirements. Attending Participation:      I personally performed the entire procedure. Volney American, DO [name] DO, DO 06/08/2021 9:16:51 AM This report has been signed electronically. Number of Addenda: 0 Note Initiated On: 06/08/2021 8:12 AM Scope Withdrawal Time: 0 hours 20 minutes 39 seconds  Total Procedure Duration: 0 hours 35 minutes 7 seconds  Estimated Blood Loss:  Estimated blood loss was minimal.      [facility]

## 2021-06-08 NOTE — Transfer of Care (Signed)
Immediate Anesthesia Transfer of Care Note  Patient: Jennifer Gardner  Procedure(s) Performed: COLONOSCOPY  Patient Location: PACU and Endoscopy Unit  Anesthesia Type:General  Level of Consciousness: awake, drowsy and patient cooperative  Airway & Oxygen Therapy: Patient Spontanous Breathing  Post-op Assessment: Report given to RN and Post -op Vital signs reviewed and stable  Post vital signs: Reviewed and stable  Last Vitals:  Vitals Value Taken Time  BP 106/66 06/08/21 0910  Temp 36 C 06/08/21 0910  Pulse 65 06/08/21 0913  Resp 23 06/08/21 0913  SpO2 100 % 06/08/21 0913  Vitals shown include unvalidated device data.  Last Pain:  Vitals:   06/08/21 0910  TempSrc: Temporal  PainSc:          Complications: No notable events documented.

## 2021-06-08 NOTE — Anesthesia Postprocedure Evaluation (Signed)
Anesthesia Post Note  Patient: Jennifer Gardner  Procedure(s) Performed: COLONOSCOPY  Patient location during evaluation: Phase II Anesthesia Type: General Level of consciousness: awake and alert, awake and oriented Pain management: pain level controlled Vital Signs Assessment: post-procedure vital signs reviewed and stable Respiratory status: spontaneous breathing, nonlabored ventilation and respiratory function stable Cardiovascular status: blood pressure returned to baseline and stable Postop Assessment: no apparent nausea or vomiting Anesthetic complications: no   No notable events documented.   Last Vitals:  Vitals:   06/08/21 0920 06/08/21 0930  BP: (!) 111/59 114/69  Pulse: 65 (!) 59  Resp: 20 14  Temp:    SpO2: 100% 100%    Last Pain:  Vitals:   06/08/21 0930  TempSrc:   PainSc: 0-No pain                 Phill Mutter

## 2021-06-08 NOTE — Anesthesia Preprocedure Evaluation (Signed)
Anesthesia Evaluation  Patient identified by MRN, date of birth, ID band Patient awake    Reviewed: Allergy & Precautions, NPO status , Patient's Chart, lab work & pertinent test results  History of Anesthesia Complications (+) history of anesthetic complications (Intubation with Colonoscopy ? Details)  Airway Mallampati: II  TM Distance: >3 FB Neck ROM: Full    Dental no notable dental hx.    Pulmonary neg pulmonary ROS,    Pulmonary exam normal        Cardiovascular hypertension, Pt. on medications negative cardio ROS Normal cardiovascular exam     Neuro/Psych PSYCHIATRIC DISORDERS Depression negative neurological ROS     GI/Hepatic negative GI ROS, Neg liver ROS,   Endo/Other  negative endocrine ROS  Renal/GU negative Renal ROS  negative genitourinary   Musculoskeletal  (+) Arthritis , Osteoarthritis,    Abdominal   Peds negative pediatric ROS (+)  Hematology negative hematology ROS (+)   Anesthesia Other Findings Actinic keratosis    Arthritis  toes, fingers  Basal cell carcinoma 07/23/2019 right upper forehead, pigmented. Excised: 08/16/2019, margins free.  Complication of anesthesia  during colooscopy "got strangled" and they had to "put a tube in my throat"  Depression  after death of father 09-19-2004  Dysplastic nevus 04/23/2007 mid back, slight atypia, base involved.  H/O benign essential tremor   History of kidney stones    Hyperlipidemia    Hypertension  PT TOOK HERSELF OFF OF HER MAXZIDE 2 WEEKS AGO (APP. FEB 2020) DUE TO DEHYDRATION AND PT STATES BP HAS BEEN NORMAL Shingles       Reproductive/Obstetrics negative OB ROS                            Anesthesia Physical Anesthesia Plan  ASA: 2  Anesthesia Plan: General   Post-op Pain Management:    Induction: Intravenous  PONV Risk Score and Plan: 2 and Propofol infusion and TIVA  Airway Management Planned: Nasal  Cannula and Natural Airway  Additional Equipment:   Intra-op Plan:   Post-operative Plan:   Informed Consent:   Plan Discussed with: CRNA, Anesthesiologist and Surgeon  Anesthesia Plan Comments:         Anesthesia Quick Evaluation

## 2021-06-12 LAB — SURGICAL PATHOLOGY

## 2021-07-02 ENCOUNTER — Other Ambulatory Visit: Payer: Self-pay

## 2021-07-02 ENCOUNTER — Ambulatory Visit: Payer: Medicare Other

## 2021-07-02 DIAGNOSIS — L570 Actinic keratosis: Secondary | ICD-10-CM

## 2021-07-02 MED ORDER — AMINOLEVULINIC ACID HCL 20 % EX SOLR
2.0000 "application " | Freq: Once | CUTANEOUS | Status: AC
  Administered 2021-07-02: 708 mg via TOPICAL

## 2021-07-02 NOTE — Patient Instructions (Signed)

## 2021-07-02 NOTE — Progress Notes (Signed)
Patient completed PDT therapy today.  1. AK (actinic keratosis) (2) Left Lower Leg - Anterior; Right Lower Leg - Anterior  Photodynamic therapy - Left Lower Leg - Anterior, Right Lower Leg - Anterior Procedure discussed: discussed risks, benefits, side effects. and alternatives   Prep: site scrubbed/prepped with acetone   Location:  Legs Number of lesions:  Multiple Type of treatment:  Blue light Aminolevulinic Acid (see MAR for details): Levulan Number of Levulan sticks used:  2 Incubation time (minutes):  120 Number of minutes under lamp:  16 Number of seconds under lamp:  40 Cooling:  Floor fan Outcome: patient tolerated procedure well with no complications   Post-procedure details: sunscreen applied    Aminolevulinic Acid HCl 20 % SOLR 708 mg - Left Lower Leg - Anterior, Right Lower Leg - Anterior   Patient given samples of Solbar sunscreen and Vanicream body wash and moisturizer.

## 2021-07-10 ENCOUNTER — Ambulatory Visit (INDEPENDENT_AMBULATORY_CARE_PROVIDER_SITE_OTHER): Payer: Medicare Other | Admitting: Dermatology

## 2021-07-10 ENCOUNTER — Other Ambulatory Visit: Payer: Self-pay

## 2021-07-10 DIAGNOSIS — L820 Inflamed seborrheic keratosis: Secondary | ICD-10-CM | POA: Diagnosis not present

## 2021-07-10 DIAGNOSIS — L738 Other specified follicular disorders: Secondary | ICD-10-CM

## 2021-07-10 DIAGNOSIS — L570 Actinic keratosis: Secondary | ICD-10-CM | POA: Diagnosis not present

## 2021-07-10 NOTE — Patient Instructions (Addendum)
Hold off cholesterol cream until after spots at legs have sloughed off      Seborrheic Keratosis  What causes seborrheic keratoses? Seborrheic keratoses are harmless, common skin growths that first appear during adult life.  As time goes by, more growths appear.  Some people may develop a large number of them.  Seborrheic keratoses appear on both covered and uncovered body parts.  They are not caused by sunlight.  The tendency to develop seborrheic keratoses can be inherited.  They vary in color from skin-colored to gray, brown, or even black.  They can be either smooth or have a rough, warty surface.   Seborrheic keratoses are superficial and look as if they were stuck on the skin.  Under the microscope this type of keratosis looks like layers upon layers of skin.  That is why at times the top layer may seem to fall off, but the rest of the growth remains and re-grows.    Treatment Seborrheic keratoses do not need to be treated, but can easily be removed in the office.  Seborrheic keratoses often cause symptoms when they rub on clothing or jewelry.  Lesions can be in the way of shaving.  If they become inflamed, they can cause itching, soreness, or burning.  Removal of a seborrheic keratosis can be accomplished by freezing, burning, or surgery. If any spot bleeds, scabs, or grows rapidly, please return to have it checked, as these can be an indication of a skin cancer.  Cryotherapy Aftercare  Wash gently with soap and water everyday.   Apply Vaseline and Band-Aid daily until healed.     If You Need Anything After Your Visit  If you have any questions or concerns for your doctor, please call our main line at [phone] and press option 4 to reach your doctor's medical assistant. If no one answers, please leave a voicemail as directed and we will return your call as soon as possible. Messages left after 4 pm will be answered the following business day.   You may also send [place] a message via  [place]. We typically respond to MyChart messages within 1-2 business days.  For prescription refills, please ask your pharmacy to contact our office. Our fax number is [phone].  If you have an urgent issue when the clinic is closed that cannot wait until the next business day, you can page your doctor at the number below.    Please note that while we do our best to be available for urgent issues outside of office hours, we are not available 24/7.   If you have an urgent issue and are unable to reach [place], you may choose to seek medical care at your doctor's office, retail clinic, urgent care center, or emergency room.  If you have a medical emergency, please immediately call 911 or go to the emergency department.  Pager Numbers  - Dr. Nehemiah Massed: [phone]  - Dr. Laurence Ferrari: [phone]  - Dr. Nicole Kindred: [phone]  In the event of inclement weather, please call our main line at [phone] for an update on the status of any delays or closures.  Dermatology Medication Tips: Please keep the boxes that topical medications come in in order to help keep track of the instructions about where and how to use these. Pharmacies typically print the medication instructions only on the boxes and not directly on the medication tubes.   If your medication is too expensive, please contact our office at [phone] option 4 or send [place] a message through  MyChart.   We are unable to tell what your co-pay for medications will be in advance as this is different depending on your insurance coverage. However, we may be able to find a substitute medication at lower cost or fill out paperwork to get insurance to cover a needed medication.   If a prior authorization is required to get your medication covered by your insurance company, please allow [place] 1-2 business days to complete this process.  Drug prices often vary depending on where the prescription is filled and some pharmacies may offer cheaper  prices.  The website www.goodrx.com contains coupons for medications through different pharmacies. The prices here do not account for what the cost may be with help from insurance (it may be cheaper with your insurance), but the website can give you the price if you did not use any insurance.  - You can print the associated coupon and take it with your prescription to the pharmacy.  - You may also stop by our office during regular business hours and pick up a GoodRx coupon card.  - If you need your prescription sent electronically to a different pharmacy, notify our office through [facility] or by phone at [phone] option 4.     Si Usted Necesita Algo Despus de Su Visita  Tambin puede enviarnos un mensaje a travs de Pharmacist, community. Por lo general respondemos a los mensajes de MyChart en el transcurso de 1 a 2 das hbiles.  Para renovar recetas, por favor pida a su farmacia que se ponga en contacto con nuestra oficina. Harland Dingwall de fax es [place] [phone].  Si tiene un asunto urgente cuando la clnica est cerrada y que no puede esperar hasta el siguiente da hbil, puede llamar/localizar a su doctor(a) al nmero que aparece a continuacin.   Por favor, tenga en cuenta que aunque hacemos todo lo posible para estar disponibles para asuntos urgentes fuera del horario de [place], no estamos disponibles las 24 horas del da, los 7 das de la [place].   Si tiene un problema urgente y no puede comunicarse con nosotros, puede optar por buscar atencin mdica  en el consultorio de su doctor(a), en una clnica privada, en un centro de atencin urgente o en una sala de emergencias.  Si tiene Engineering geologist, por favor llame inmediatamente al 911 o vaya a la sala de emergencias.  Nmeros de bper  - Dr. Nehemiah Massed: [phone]  - Dra. Moye: [phone]  - Dra. Nicole Kindred: [phone]  En caso de inclemencias del [place], por favor llame a Johnsie Kindred principal al [phone]  para una actualizacin sobre el [place] de cualquier retraso o cierre.  Consejos para la medicacin en dermatologa: Por favor, guarde las cajas en las que vienen los medicamentos de uso tpico para ayudarle a seguir las instrucciones sobre dnde y cmo usarlos. Las farmacias generalmente imprimen las instrucciones del medicamento slo en las cajas y no directamente en los tubos del [place].   Si su medicamento es muy caro, por favor, pngase en contacto con Zigmund Daniel llamando al [phone] y presione la opcin 4 o envenos un mensaje a travs de Pharmacist, community.   No podemos decirle cul ser su copago por los medicamentos por adelantado ya que esto es diferente dependiendo de la cobertura de su seguro. Sin embargo, es posible que podamos encontrar un medicamento sustituto a Electrical engineer un formulario para que el seguro cubra el medicamento que se considera necesario.   Si se requiere una autorizacin previa  para que su compaa de seguros [place] su medicamento, por favor permtanos de 1 a 2 das hbiles para completar este proceso.  Los precios de los medicamentos varan con frecuencia dependiendo del Environmental consultant de dnde se surte la receta y alguna farmacias pueden ofrecer precios ms baratos.  El sitio web www.goodrx.com tiene cupones para medicamentos de Airline pilot. Los precios aqu no tienen en cuenta lo que podra costar con la ayuda del seguro (puede ser ms barato con su seguro), pero el sitio web puede darle el precio si no utiliz Research scientist (physical sciences).  - Puede imprimir el cupn correspondiente y llevarlo con su receta a la farmacia.  - Tambin puede pasar por nuestra oficina durante el horario de atencin regular y Charity fundraiser una tarjeta de cupones de GoodRx.  - Si necesita que su receta se enve electrnicamente a una farmacia diferente, informe a nuestra oficina a travs de MyChart de [place] o por telfono llamando al [phone] y presione la opcin 4.

## 2021-07-10 NOTE — Progress Notes (Signed)
Follow-Up Visit   Subjective  Jennifer Gardner is a 66 y.o. female who presents for the following: Follow-up (Patient here today for treatment of sebaceous hyperplasia at right jaw/chin area. She also has a spot left leg she would like examined. ).  It gets irritated, itchy.  The following portions of the chart were reviewed this encounter and updated as appropriate:      Review of Systems: No other skin or systemic complaints except as noted in HPI or Assessment and Plan.   Objective  Well appearing patient in no apparent distress; mood and affect are within normal limits.  A focused examination was performed including face, and bilateral legs. Relevant physical exam findings are noted in the Assessment and Plan.  right jaw / chin Scattered yellow papules of chin forehead, cheeks and right>left chin/jaw             BL lower legs Numerous pink/red scaly macules  left lateral lower leg x 1 Erythematous stuck-on, waxy papule   Assessment & Plan  Sebaceous hyperplasia of face right jaw / chin  Benign Patient signed non covered procedure form and would like multiple areas treated today at entire face including right and left chin, cheeks and forehead. ED to 24 lesions today. Not covered by insurance.    Continue tretinoin cream qhs as tolerated  Discussed cosmetic procedure (ED), noncovered.  $60 for 1st lesion and $15 for each additional lesion if done on the same day.  Maximum charge $350.  One touch-up treatment included no charge. Discussed risks of treatment including dyspigmentation, small scar, and/or recurrence. Recommend daily broad spectrum sunscreen SPF 30+/photoprotection to treated areas once healed.         Destruction of lesion - right jaw / chin  Destruction method: electrodesiccation and curettage   Destruction method comment:  Electrodesiccation only Informed consent: discussed and consent obtained   Timeout:  patient name, date of birth,  surgical site, and procedure verified Patient was prepped and draped in usual sterile fashion: patient was prepped with isopropyl alcohol. Outcome: patient tolerated procedure well with no complications    Actinic keratosis BL lower legs  Good reaction post PDT treatment, currently healing  Pt will get 2nd treatment in 3-4 weeks.   Actinic keratoses are precancerous spots that appear secondary to cumulative UV radiation exposure/sun exposure over time. They are chronic with expected duration over 1 year. A portion of actinic keratoses will progress to squamous cell carcinoma of the skin. It is not possible to reliably predict which spots will progress to skin cancer and so treatment is recommended to prevent development of skin cancer.  Recommend daily broad spectrum sunscreen SPF 30+ to sun-exposed areas, reapply every 2 hours as needed.  Recommend staying in the shade or wearing long sleeves, sun glasses (UVA+UVB protection) and wide brim hats (4-inch brim around the entire circumference of the hat). Call for new or changing lesions.  Inflamed seborrheic keratosis left lateral lower leg x 1  Irritated   Destruction of lesion - left lateral lower leg x 1  Destruction method: cryotherapy   Informed consent: discussed and consent obtained   Lesion destroyed using liquid nitrogen: Yes   Region frozen until ice ball extended beyond lesion: Yes   Outcome: patient tolerated procedure well with no complications   Post-procedure details: wound care instructions given   Additional details:  Prior to procedure, discussed risks of blister formation, small wound, skin dyspigmentation, or rare scar following cryotherapy. Recommend Vaseline ointment  to treated areas while healing.    Return in 3 weeks (on 07/31/2021) for schedule pdt at legs , keep follow up as scheduled with Dr. Sharman Crate, Ruthell Rummage, CMA, am acting as scribe for Brendolyn Patty, MD.  Documentation: I have reviewed the above  documentation for accuracy and completeness, and I agree with the above.  Brendolyn Patty MD

## 2021-07-30 ENCOUNTER — Other Ambulatory Visit: Payer: Self-pay

## 2021-07-30 ENCOUNTER — Ambulatory Visit (INDEPENDENT_AMBULATORY_CARE_PROVIDER_SITE_OTHER): Payer: Medicare Other

## 2021-07-30 DIAGNOSIS — L570 Actinic keratosis: Secondary | ICD-10-CM | POA: Diagnosis not present

## 2021-07-30 MED ORDER — AMINOLEVULINIC ACID HCL 20 % EX SOLR
2.0000 "application " | Freq: Once | CUTANEOUS | Status: AC
  Administered 2021-07-30: 708 mg via TOPICAL

## 2021-07-30 NOTE — Patient Instructions (Signed)

## 2021-07-30 NOTE — Progress Notes (Signed)
Patient completed PDT therapy today.  1. AK (actinic keratosis) (2) Left Lower Leg - Anterior; Right Lower Leg - Anterior  Photodynamic therapy - Left Lower Leg - Anterior, Right Lower Leg - Anterior Procedure discussed: discussed risks, benefits, side effects. and alternatives   Prep: site scrubbed/prepped with acetone   Location:  Legs Number of lesions:  Multiple Type of treatment:  Blue light Aminolevulinic Acid (see MAR for details): Levulan Number of Levulan sticks used:  2 Incubation time (minutes):  120 Number of minutes under lamp:  16 Number of seconds under lamp:  40 Cooling:  Floor fan Outcome: patient tolerated procedure well with no complications   Post-procedure details: sunscreen applied    Aminolevulinic Acid HCl 20 % SOLR 708 mg - Left Lower Leg - Anterior, Right Lower Leg - Anterior   Patient provided samples of Solbar Suncreen and Biomedical engineer. aw

## 2021-08-15 ENCOUNTER — Other Ambulatory Visit: Payer: Self-pay | Admitting: Physician Assistant

## 2021-08-15 DIAGNOSIS — Z1231 Encounter for screening mammogram for malignant neoplasm of breast: Secondary | ICD-10-CM

## 2021-08-17 ENCOUNTER — Telehealth: Payer: Self-pay

## 2021-08-17 NOTE — Telephone Encounter (Signed)
New message   Spoke with Vina from [place] [facility] [place]  Medication Trihexyphenidyl 2 mg   Non -Formulary Exception Request form

## 2021-09-10 ENCOUNTER — Ambulatory Visit: Payer: Medicare Other | Admitting: Dermatology

## 2021-09-10 ENCOUNTER — Other Ambulatory Visit: Payer: Self-pay

## 2021-09-10 DIAGNOSIS — C44612 Basal cell carcinoma of skin of right upper limb, including shoulder: Secondary | ICD-10-CM

## 2021-09-10 DIAGNOSIS — L738 Other specified follicular disorders: Secondary | ICD-10-CM | POA: Diagnosis not present

## 2021-09-10 DIAGNOSIS — D485 Neoplasm of uncertain behavior of skin: Secondary | ICD-10-CM

## 2021-09-10 DIAGNOSIS — Z85828 Personal history of other malignant neoplasm of skin: Secondary | ICD-10-CM | POA: Diagnosis not present

## 2021-09-10 DIAGNOSIS — L570 Actinic keratosis: Secondary | ICD-10-CM

## 2021-09-10 DIAGNOSIS — L565 Disseminated superficial actinic porokeratosis (DSAP): Secondary | ICD-10-CM

## 2021-09-10 DIAGNOSIS — L578 Other skin changes due to chronic exposure to nonionizing radiation: Secondary | ICD-10-CM

## 2021-09-10 NOTE — Patient Instructions (Addendum)
Wound Care Instructions ? ?Cleanse wound gently with soap and water once a day then pat dry with clean gauze. Apply a thing coat of Petrolatum (petroleum jelly, "Vaseline") over the wound (unless you have an allergy to this). We recommend that you use a new, sterile tube of Vaseline. Do not pick or remove scabs. Do not remove the yellow or white "healing tissue" from the base of the wound. ? ?Cover the wound with fresh, clean, nonstick gauze and secure with paper tape. You may use Band-Aids in place of gauze and tape if the would is small enough, but would recommend trimming much of the tape off as there is often too much. Sometimes Band-Aids can irritate the skin. ? ?You should call the office for your biopsy report after 1 week if you have not already been contacted. ? ?If you experience any problems, such as abnormal amounts of bleeding, swelling, significant bruising, significant pain, or evidence of infection, please call the office immediately. ? ?FOR ADULT SURGERY PATIENTS: If you need something for pain relief you may take 1 extra strength Tylenol (acetaminophen) AND 2 Ibuprofen ('200mg'$  each) together every 4 hours as needed for pain. (do not take these if you are allergic to them or if you have a reason you should not take them.) Typically, you may only need pain medication for 1 to 3 days.  ? ?Cryotherapy Aftercare ? ?Wash gently with soap and water everyday.   ?Apply Vaseline and Band-Aid daily until healed.  ? ?If You Need Anything After Your Visit ? ?If you have any questions or concerns for your doctor, please call our main line at [phone] and press option 4 to reach your doctor's medical assistant. If no one answers, please leave a voicemail as directed and we will return your call as soon as possible. Messages left after 4 pm will be answered the following business day.  ? ?You may also send [place] a message via MyChart. We typically respond to MyChart messages within 1-2 business days. ? ?For  prescription refills, please ask your pharmacy to contact our office. Our fax number is [phone]. ? ?If you have an urgent issue when the clinic is closed that cannot wait until the next business day, you can page your doctor at the number below.   ? ?Please note that while we do our best to be available for urgent issues outside of office hours, we are not available 24/7.  ? ?If you have an urgent issue and are unable to reach [place], you may choose to seek medical care at your doctor's office, retail clinic, urgent care center, or emergency room. ? ?If you have a medical emergency, please immediately call 911 or go to the emergency department. ? ?Pager Numbers ? ?- Dr. Nehemiah Massed: [phone] ? ?- Dr. Laurence Ferrari: [phone] ? ?- Dr. Nicole Kindred: [phone] ? ?In the event of inclement weather, please call our main line at [phone] for an update on the status of any delays or closures. ? ?Dermatology Medication Tips: ?Please keep the boxes that topical medications come in in order to help keep track of the instructions about where and how to use these. Pharmacies typically print the medication instructions only on the boxes and not directly on the medication tubes.  ? ?If your medication is too expensive, please contact our office at [phone] option 4 or send [place] a message through [place].  ? ?We are unable to tell what your co-pay for medications will be in advance as this is different  depending on your insurance coverage. However, we may be able to find a substitute medication at lower cost or fill out paperwork to get insurance to cover a needed medication.  ? ?If a prior authorization is required to get your medication covered by your insurance company, please allow [place] 1-2 business days to complete this process. ? ?Drug prices often vary depending on where the prescription is filled and some pharmacies may offer cheaper prices. ? ?The website www.goodrx.com contains coupons for medications through different  pharmacies. The prices here do not account for what the cost may be with help from insurance (it may be cheaper with your insurance), but the website can give you the price if you did not use any insurance.  ?- You can print the associated coupon and take it with your prescription to the pharmacy.  ?- You may also stop by our office during regular business hours and pick up a GoodRx coupon card.  ?- If you need your prescription sent electronically to a different pharmacy, notify our office through [facility] or by phone at [phone] option 4. ? ? ? ? ?Si Usted Necesita Algo Despu?s de Su Visita ? ?Tambi?n puede enviarnos un mensaje a trav?s de MyChart. Por lo general respondemos a los mensajes de MyChart en el transcurso de 1 a 2 d?as h?biles. ? ?Para renovar recetas, por favor pida a su farmacia que se ponga en contacto con nuestra oficina. Nuestro n?mero de fax es el [phone]. ? ?Si tiene un asunto urgente cuando la cl?nica est? cerrada y que no puede esperar hasta el siguiente d?a h?bil, puede llamar/localizar a su doctor(a) al n?mero que aparece a continuaci?n.  ? ?Por favor, tenga en cuenta que aunque hacemos todo lo posible para estar disponibles para asuntos urgentes fuera del horario de oficina, no estamos disponibles las 24 horas del d?a, los 7 d?as de la semana.  ? ?Si tiene un problema urgente y no puede comunicarse con nosotros, puede optar por buscar atenci?n m?dica  en el consultorio de su doctor(a), en una cl?nica privada, en un centro de atenci?n urgente o en una sala de emergencias. ? ?Si tiene Engineer, maintenance (IT) m?dica, por favor llame inmediatamente al 911 o vaya a la sala de emergencias. ? ?N?meros de b?per ? ?- Dr. Nehemiah Massed: [phone] ? ?- Dra. Moye: [phone] ? ?- Dra. Nicole Kindred: [phone] ? ?En caso de inclemencias del tiempo, por favor llame a nuestra l?nea principal al [phone] para una actualizaci?n sobre el estado de cualquier retraso o cierre. ? ?Consejos para la  medicaci?n en dermatolog?a: ?Por favor, guarde las cajas en las que vienen los medicamentos de uso t?pico para ayudarle a seguir las instrucciones sobre d?nde y c?mo usarlos. Las farmacias generalmente imprimen las instrucciones del medicamento s?lo en las cajas y no directamente en los tubos del [place].  ? ?Si su medicamento es muy caro, por favor, p?ngase en contacto con Zigmund Daniel llamando al [phone] y presione la opci?n 4 o env?enos un mensaje a trav?s de MyChart.  ? ?No podemos decirle cu?l ser? su copago por los medicamentos por adelantado ya que esto es diferente dependiendo de la cobertura de su seguro. Sin embargo, es posible que podamos encontrar un medicamento sustituto a Electrical engineer un formulario para que el seguro cubra el medicamento que se considera necesario.  ? ?Si se requiere Ardelia Mems autorizaci?n previa para que su compa??a de seguros [place] su medicamento, por favor perm?tanos de 1 a 2 d?as h?biles para completar este  proceso. ? ?Los precios de los medicamentos var?an con frecuencia dependiendo del Environmental consultant de d?nde se surte la receta y alguna farmacias pueden ofrecer precios m?s baratos. ? ?El sitio web www.goodrx.com tiene cupones para medicamentos de Airline pilot. Los precios aqu? no tienen en cuenta lo que podr?a costar con la ayuda del seguro (puede ser m?s barato con su seguro), pero el sitio web puede darle el precio si no utiliz? ning?n seguro.  ?- Puede imprimir el cup?n correspondiente y llevarlo con su receta a la farmacia.  ?- Tambi?n puede pasar por nuestra oficina durante el horario de atenci?n regular y recoger una tarjeta de cupones de GoodRx.  ?- Si necesita que su receta se env?e electr?nicamente a Chiropodist, informe a nuestra oficina a trav?s de MyChart de  o por tel?fono llamando al [phone] y presione la opci?n 4. ? ?

## 2021-09-10 NOTE — Progress Notes (Signed)
? ?Follow-Up Visit ?  ?Subjective  ?Jennifer Gardner is a 66 y.o. female who presents for the following: Follow-up. ? ?Patient presents for 6 month follow-up DSAP of the lower legs. She is improved after PDT treatments x 2, 07/02/21 and 07/30/21. She has used Cholesterol/Lovastatin Cream to lower legs, not currently since PDT treatment. She has used 5FU/Calcipotriene Cream in the past without much improvement. She has a new scaly spot on her left foot and abdomen to be checked, itchy. And a new pink spot on her shoulder. ? ?The following portions of the chart were reviewed this encounter and updated as appropriate:  ?  ?  ? ?Review of Systems:  No other skin or systemic complaints except as noted in HPI or Assessment and Plan. ? ?Objective  ?Well appearing patient in no apparent distress; mood and affect are within normal limits. ? ?A focused examination was performed including face, lower legs. Relevant physical exam findings are noted in the Assessment and Plan. ? ?bilateral lower legs ?Multiple light pink and violaceous/hyperpigmented thin papules mild scale on bilateral legs  ? ?Left Foot Dorsum x 1, L upper forehead x 1, L mid abdomen x 1 (3) ?Light pink scaly patch, slightly waxy on foot and abdomen,  pink scaly macule forehead ? ?R ant shoulder ?8.0 mm pink brown papule ? ? ? ? ? ? ?face ?Clear ? ? ? ?Assessment & Plan  ?DSAP (disseminated superficial actinic porokeratosis) ?bilateral lower legs ? ?DSAP is a chronic inherited condition of sun-exposed skin, most commonly affecting the arms and legs.  It is difficult to treat.  Recommend photoprotection and regular use of spf 30 or higher sunscreen to prevent worsening of condition and precancerous changes. ? ?Improving s/p PDT txs x 2 ? ?Restart Cholesterol 2% Lovastatin 2% Cream 240 gm- Apply twice daily as directed to affected areas arms and legs.  Sent to [facility] ? ?AK (actinic keratosis) (3) ?Left Foot Dorsum x 1, L upper forehead x 1, L mid abdomen x  1 ? ?vs ISK - L foot dorsum, L mid abdomen ? ?Actinic keratoses are precancerous spots that appear secondary to cumulative UV radiation exposure/sun exposure over time. They are chronic with expected duration over 1 year. A portion of actinic keratoses will progress to squamous cell carcinoma of the skin. It is not possible to reliably predict which spots will progress to skin cancer and so treatment is recommended to prevent development of skin cancer. ? ?Recommend daily broad spectrum sunscreen SPF 30+ to sun-exposed areas, reapply every 2 hours as needed.  ?Recommend staying in the shade or wearing long sleeves, sun glasses (UVA+UVB protection) and wide brim hats (4-inch brim around the entire circumference of the hat). ?Call for new or changing lesions. ? ?Destruction of lesion - Left Foot Dorsum x 1, L upper forehead x 1, L mid abdomen x 1 ? ?Destruction method: cryotherapy   ?Informed consent: discussed and consent obtained   ?Lesion destroyed using liquid nitrogen: Yes   ?Region frozen until ice ball extended beyond lesion: Yes   ?Outcome: patient tolerated procedure well with no complications   ?Post-procedure details: wound care instructions given   ?Additional details:  Prior to procedure, discussed risks of blister formation, small wound, skin dyspigmentation, or rare scar following cryotherapy. Recommend Vaseline ointment to treated areas while healing. ? ? ?Neoplasm of uncertain behavior of skin ?R ant shoulder ? ?Skin / nail biopsy ?Type of biopsy: tangential   ?Informed consent: discussed and consent obtained   ?  Patient was prepped and draped in usual sterile fashion: Area prepped with alcohol. ?Anesthesia: the lesion was anesthetized in a standard fashion   ?Anesthetic:  1% lidocaine w/ epinephrine 1-100,000 buffered w/ 8.4% NaHCO3 ?Instrument used: flexible razor blade   ?Hemostasis achieved with: pressure, aluminum chloride and electrodesiccation   ?Outcome: patient tolerated procedure well    ?Post-procedure details: wound care instructions given   ?Post-procedure details comment:  Ointment and small bandage applied ? ?Specimen 1 - Surgical pathology ?Differential Diagnosis: Inflamed SK r/o BCC ?Check Margins: No ?8.0 mm pink brown papule ? ?Sebaceous hyperplasia ?face ? ?Improved s/p ED treatment. Photos compared. Patient pleased with results.  ? ?Actinic Damage ?- chronic, secondary to cumulative UV radiation exposure/sun exposure over time ?- diffuse scaly erythematous macules with underlying dyspigmentation ?- Recommend daily broad spectrum sunscreen SPF 30+ to sun-exposed areas, reapply every 2 hours as needed.  ?- Recommend staying in the shade or wearing long sleeves, sun glasses (UVA+UVB protection) and wide brim hats (4-inch brim around the entire circumference of the hat). ?- Call for new or changing lesions.  ? ?History of Skin Cancer SCC/BCC ? ?Clear. Observe for recurrence.  ?Call clinic for new or changing lesions.   ?Recommend regular skin exams, daily broad-spectrum spf 30+ sunscreen use, and photoprotection.  ?    ?Return in about 6 months (around 03/13/2022) for TBSE, Hx SCC, Hx BCC, DSAP. ? ?I, Jamesetta Orleans, CMA, am acting as scribe for Brendolyn Patty, MD . ?Documentation: I have reviewed the above documentation for accuracy and completeness, and I agree with the above. ? ?Brendolyn Patty MD  ? ? ?

## 2021-09-12 ENCOUNTER — Telehealth: Payer: Self-pay

## 2021-09-12 NOTE — Telephone Encounter (Signed)
-----   Message from Brendolyn Patty, MD sent at 09/11/2021  5:34 PM EDT ----- ?Skin , right ant shoulder ?BASAL CELL CARCINOMA, SUPERFICIAL AND NODULAR PATTERNS, BASE INVOLVED ? ?BCC skin cancer, needs EDC ? ? - please call patient ?

## 2021-09-12 NOTE — Telephone Encounter (Signed)
Left pt message to call for bx result/sh 

## 2021-09-12 NOTE — Telephone Encounter (Signed)
Patient advised of BX results and scheduled for EDC. aw 

## 2021-09-20 ENCOUNTER — Ambulatory Visit
Admission: RE | Admit: 2021-09-20 | Discharge: 2021-09-20 | Disposition: A | Discharge status: Home / Self Care | Payer: Medicare Other | Source: Ambulatory Visit | Attending: Physician Assistant | Admitting: Physician Assistant

## 2021-09-20 ENCOUNTER — Telehealth: Payer: Self-pay | Admitting: Neurology

## 2021-09-20 DIAGNOSIS — Z1231 Encounter for screening mammogram for malignant neoplasm of breast: Secondary | ICD-10-CM | POA: Diagnosis present

## 2021-09-20 NOTE — Telephone Encounter (Signed)
Patient called and stated that her medication trihexyphenidyl has been denied by [employer].  She stated that she had talked to someone the beginning of March and no one has gotten back to her. ?

## 2021-09-20 NOTE — Telephone Encounter (Signed)
Medication had been denied twice before this in June with the pharmacy being told that PCP was prescribing medication. I refilled medication on 02-14-21. I called patient because [employer] Quint had stared an appeal for her medication in  08-17-21 because PCP is not willing to help patient with a PA or appeal. Patient stating she just needs some help PCP states it is a neurology issue. Patient willing to do anything Dr. Carles Collet feels will be best. If she needs to come in for an appointment she will. This medication Trihexyphenidyl and Topamax have really helped patient with her Essential Tremors. Patient just looking for help. I did speak to [employer] when appeal was put in that they needed to speak to PCP about this and the PCP is not willing to handle this as she is saying it is neuro issue   ?

## 2021-09-24 NOTE — Telephone Encounter (Signed)
Called left patient voicemail message  ?

## 2021-09-24 NOTE — Telephone Encounter (Signed)
Called patient and let her know we would not be able to do PA but the Good RX program and [employer] she can get discounted medication ?

## 2021-09-26 ENCOUNTER — Other Ambulatory Visit: Payer: Self-pay | Admitting: Physician Assistant

## 2021-09-26 ENCOUNTER — Ambulatory Visit
Admission: RE | Admit: 2021-09-26 | Discharge: 2021-09-26 | Disposition: A | Discharge status: Home / Self Care | Payer: Medicare Other | Source: Ambulatory Visit | Attending: Physician Assistant | Admitting: Physician Assistant

## 2021-09-26 DIAGNOSIS — R928 Other abnormal and inconclusive findings on diagnostic imaging of breast: Secondary | ICD-10-CM | POA: Diagnosis present

## 2021-09-26 DIAGNOSIS — N6489 Other specified disorders of breast: Secondary | ICD-10-CM | POA: Diagnosis present

## 2021-10-03 ENCOUNTER — Encounter: Payer: Self-pay | Admitting: Dermatology

## 2021-10-03 ENCOUNTER — Ambulatory Visit: Payer: Medicare Other | Admitting: Dermatology

## 2021-10-03 DIAGNOSIS — L578 Other skin changes due to chronic exposure to nonionizing radiation: Secondary | ICD-10-CM

## 2021-10-03 DIAGNOSIS — C44612 Basal cell carcinoma of skin of right upper limb, including shoulder: Secondary | ICD-10-CM | POA: Diagnosis not present

## 2021-10-03 NOTE — Progress Notes (Signed)
? ?  Follow-Up Visit ?  ?Subjective  ?Jennifer Gardner is a 66 y.o. female who presents for the following: Skin Cancer (Here to treat Bx proven BCC at right anterior shoulder). ? ? ? ?The following portions of the chart were reviewed this encounter and updated as appropriate:   ?  ? ?Review of Systems: No other skin or systemic complaints except as noted in HPI or Assessment and Plan. ? ? ?Objective  ?Well appearing patient in no apparent distress; mood and affect are within normal limits. ? ?A focused examination was performed including face, right arm. Relevant physical exam findings are noted in the Assessment and Plan. ? ?Right Shoulder - Anterior ?0.8 cm pink healing biopsy site ? ? ?Assessment & Plan  ?Basal cell carcinoma (BCC) of skin of right upper extremity including shoulder ?Right Shoulder - Anterior ? ?Destruction of lesion ? ?Destruction method: electrodesiccation and curettage   ?Timeout:  patient name, date of birth, surgical site, and procedure verified ?Anesthesia: the lesion was anesthetized in a standard fashion   ?Anesthetic:  1% lidocaine w/ epinephrine 1-100,000 buffered w/ 8.4% NaHCO3 ?Curettage performed in three different directions: Yes   ?Electrodesiccation performed over the curetted area: Yes   ?Final wound size (cm):  1.1 ?Hemostasis achieved with:  pressure, aluminum chloride and electrodesiccation ?Outcome: patient tolerated procedure well with no complications   ?Post-procedure details: wound care instructions given   ?Additional details:  Mupirocin ointment and Bandaid applied ?  ? ?Bx proven bcc superficial and nodular patterns base involved ?[id number] ? ?Treated today with ED&C ? ?Actinic Damage ?- chronic, secondary to cumulative UV radiation exposure/sun exposure over time ?- diffuse scaly erythematous macules with underlying dyspigmentation ?- Recommend daily broad spectrum sunscreen SPF 30+ to sun-exposed areas, reapply every 2 hours as needed.  ?- Recommend staying in the  shade or wearing long sleeves, sun glasses (UVA+UVB protection) and wide brim hats (4-inch brim around the entire circumference of the hat). ?- Call for new or changing lesions.  ? ?Return if symptoms worsen or fail to improve, for keep follow up as schedule 10/23. ?I, Ruthell Rummage, CMA, am acting as scribe for Brendolyn Patty, MD. ? ?Documentation: I have reviewed the above documentation for accuracy and completeness, and I agree with the above. ? ?Brendolyn Patty MD  ?

## 2021-10-03 NOTE — Patient Instructions (Signed)

## 2022-03-06 ENCOUNTER — Other Ambulatory Visit: Payer: Self-pay | Admitting: Physician Assistant

## 2022-03-06 DIAGNOSIS — N6489 Other specified disorders of breast: Secondary | ICD-10-CM

## 2022-03-26 ENCOUNTER — Ambulatory Visit: Payer: Medicare Other | Admitting: Dermatology

## 2022-04-01 ENCOUNTER — Ambulatory Visit (INDEPENDENT_AMBULATORY_CARE_PROVIDER_SITE_OTHER): Payer: Medicare Other | Admitting: Dermatology

## 2022-04-01 DIAGNOSIS — L578 Other skin changes due to chronic exposure to nonionizing radiation: Secondary | ICD-10-CM

## 2022-04-01 DIAGNOSIS — Z85828 Personal history of other malignant neoplasm of skin: Secondary | ICD-10-CM | POA: Diagnosis not present

## 2022-04-01 DIAGNOSIS — L565 Disseminated superficial actinic porokeratosis (DSAP): Secondary | ICD-10-CM

## 2022-04-01 DIAGNOSIS — L570 Actinic keratosis: Secondary | ICD-10-CM | POA: Diagnosis not present

## 2022-04-01 DIAGNOSIS — L821 Other seborrheic keratosis: Secondary | ICD-10-CM

## 2022-04-01 DIAGNOSIS — D225 Melanocytic nevi of trunk: Secondary | ICD-10-CM

## 2022-04-01 DIAGNOSIS — Z1283 Encounter for screening for malignant neoplasm of skin: Secondary | ICD-10-CM | POA: Diagnosis not present

## 2022-04-01 DIAGNOSIS — Z86018 Personal history of other benign neoplasm: Secondary | ICD-10-CM

## 2022-04-01 DIAGNOSIS — L814 Other melanin hyperpigmentation: Secondary | ICD-10-CM

## 2022-04-01 MED ORDER — CHOLESTEROL POWD: 1.0000 | Freq: Two times a day (BID) | 6 refills | Status: AC

## 2022-04-01 NOTE — Progress Notes (Signed)
Follow-Up Visit   Subjective  Jennifer Gardner is a 66 y.o. female who presents for the following: Total body skin exam (Hx of BCCs, hx of SCC, hx of Dysplastic nevus, hx of AKs) and DSAP (Bil lower legs, bil arms SM Cholesterol/Lovastatin cr bid ).  The patient presents for Total-Body Skin Exam (TBSE) for skin cancer screening and mole check.  The patient has spots, moles and lesions to be evaluated, some may be new or changing and the patient has concerns that these could be cancer.   The following portions of the chart were reviewed this encounter and updated as appropriate:       Review of Systems:  No other skin or systemic complaints except as noted in HPI or Assessment and Plan.  Objective  Well appearing patient in no apparent distress; mood and affect are within normal limits.  A full examination was performed including scalp, head, eyes, ears, nose, lips, neck, chest, axillae, abdomen, back, buttocks, bilateral upper extremities, bilateral lower extremities, hands, feet, fingers, toes, fingernails, and toenails. All findings within normal limits unless otherwise noted below.  bil arms, bil legs Numerous pink/brown macules with mild scale arms, legs  R upper arm x 2, R wrist x 1, R forearm x 2, L lower leg x 3, L ant thigh x 3, lower sternum x 1, L shoulder x 1,Top of R shoulder x 1, L med shoulder x 1, Inferior to R clavicle x 1, L forearm x 2 (18) Pink scaly macules Lower sternum pink scaly macule adjacent to white scar Top of Right shoulder and Inferior to right clavicle pink scaly macule adjacent to white scar    Assessment & Plan   Lentigines - Scattered tan macules - Due to sun exposure - Benign-appearing, observe - Recommend daily broad spectrum sunscreen SPF 30+ to sun-exposed areas, reapply every 2 hours as needed. - Call for any changes - back  Seborrheic Keratoses - Stuck-on, waxy, tan-brown papules and/or plaques  - Benign-appearing - Discussed benign  etiology and prognosis. - Observe - Call for any changes - back  Melanocytic Nevi - Tan-brown and/or pink-flesh-colored symmetric macules and papules - Benign appearing on exam today - Observation - Call clinic for new or changing moles - Recommend daily use of broad spectrum spf 30+ sunscreen to sun-exposed areas.  - back, buttocks  Actinic Damage - Chronic condition, secondary to cumulative UV/sun exposure - diffuse scaly erythematous macules with underlying dyspigmentation - Recommend daily broad spectrum sunscreen SPF 30+ to sun-exposed areas, reapply every 2 hours as needed.  - Staying in the shade or wearing long sleeves, sun glasses (UVA+UVB protection) and wide brim hats (4-inch brim around the entire circumference of the hat) are also recommended for sun protection.  - Call for new or changing lesions.  Skin cancer screening performed today.   History of Basal Cell Carcinoma of the Skin - No evidence of recurrence today, including R anterior shoulder - Recommend regular full body skin exams - Recommend daily broad spectrum sunscreen SPF 30+ to sun-exposed areas, reapply every 2 hours as needed.  - Call if any new or changing lesions are noted between office visits  - R upper forehead, R ant shoulder  History of Squamous Cell Carcinoma of the Skin - No evidence of recurrence today - Recommend regular full body skin exams - Recommend daily broad spectrum sunscreen SPF 30+ to sun-exposed areas, reapply every 2 hours as needed.  - Call if any new or changing lesions  are noted between office visits - L medial lower leg, L med pretibial, L lat lower leg  History of Dysplastic Nevi - No evidence of recurrence today - Recommend regular full body skin exams - Recommend daily broad spectrum sunscreen SPF 30+ to sun-exposed areas, reapply every 2 hours as needed.  - Call if any new or changing lesions are noted between office visits  - mid back   DSAP (disseminated  superficial actinic porokeratosis) bil arms, bil legs  Chronic and persistent condition with duration or expected duration over one year. Condition is symptomatic/ bothersome to patient. Not currently at goal.  Some improvement with SM cream as below.  DSAP is a chronic inherited condition of sun-exposed skin, most commonly affecting the arms and legs.  It is difficult to treat.  Recommend photoprotection and regular use of spf 30 or higher sunscreen to prevent worsening of condition and precancerous changes.  Continue SM Cholesterol 2% Lovastatin 2% Cream 240 gm- Apply twice daily as directed to affected areas arms and legs.   Cholesterol POWD - bil arms, bil legs 1 Application by Does not apply route in the morning and at bedtime. Bid to aa arms and legs  AK (actinic keratosis) (18) R upper arm x 2, R wrist x 1, R forearm x 2, L lower leg x 3, L ant thigh x 3, lower sternum x 1, L shoulder x 1,Top of R shoulder x 1, L med shoulder x 1, Inferior to R clavicle x 1, L forearm x 2  Recheck R shoulder, clavicle, and sternum on f/up  Actinic keratoses are precancerous spots that appear secondary to cumulative UV radiation exposure/sun exposure over time. They are chronic with expected duration over 1 year. A portion of actinic keratoses will progress to squamous cell carcinoma of the skin. It is not possible to reliably predict which spots will progress to skin cancer and so treatment is recommended to prevent development of skin cancer.  Recommend daily broad spectrum sunscreen SPF 30+ to sun-exposed areas, reapply every 2 hours as needed.  Recommend staying in the shade or wearing long sleeves, sun glasses (UVA+UVB protection) and wide brim hats (4-inch brim around the entire circumference of the hat). Call for new or changing lesions.   Destruction of lesion - R upper arm x 2, R wrist x 1, R forearm x 2, L lower leg x 3, L ant thigh x 3, lower sternum x 1, L shoulder x 1,Top of R shoulder x 1,  L med shoulder x 1, Inferior to R clavicle x 1, L forearm x 2  Destruction method: cryotherapy   Informed consent: discussed and consent obtained   Lesion destroyed using liquid nitrogen: Yes   Region frozen until ice ball extended beyond lesion: Yes   Outcome: patient tolerated procedure well with no complications   Post-procedure details: wound care instructions given   Additional details:  Prior to procedure, discussed risks of blister formation, small wound, skin dyspigmentation, or rare scar following cryotherapy. Recommend Vaseline ointment to treated areas while healing.    Return in about 6 months (around 10/01/2022) for AK f/u.  I, Othelia Pulling, RMA, am acting as scribe for Brendolyn Patty, MD .  Documentation: I have reviewed the above documentation for accuracy and completeness, and I agree with the above.  Brendolyn Patty MD

## 2022-04-01 NOTE — Patient Instructions (Addendum)
Cryotherapy Aftercare  Wash gently with soap and water everyday.   Apply Vaseline and Band-Aid daily until healed.   Instructions for [facility] Medications  One or more of your medications was sent to the [facility] mail order compounding pharmacy. You will receive an email from them and can purchase the medicine through that link. It will then be mailed to your home at the address you confirmed. If for any reason you do not receive an email from them, please check your spam folder. If you still do not find the email, please let us know. [facility] phone number is [phone].   Due to recent changes in healthcare laws, you may see results of your pathology and/or laboratory studies on MyChart before the doctors have had a chance to review them. We understand that in some cases there may be results that are confusing or concerning to you. Please understand that not all results are received at the same time and often the doctors may need to interpret multiple results in order to provide you with the best plan of care or course of treatment. Therefore, we ask that you please give [place] 2 business days to thoroughly review all your results before contacting the office for clarification. Should we see a critical lab result, you will be contacted sooner.   If You Need Anything After Your Visit  If you have any questions or concerns for your doctor, please call our main line at [phone] and press option 4 to reach your doctor's medical assistant. If no one answers, please leave a voicemail as directed and we will return your call as soon as possible. Messages left after 4 pm will be answered the following business day.   You may also send [place] a message via [place]. We typically respond to MyChart messages within 1-2 business days.  For prescription refills, please ask your pharmacy to contact our office. Our fax number is [phone].  If you have an urgent issue when the clinic is  closed that cannot wait until the next business day, you can page your doctor at the number below.    Please note that while we do our best to be available for urgent issues outside of office hours, we are not available 24/7.   If you have an urgent issue and are unable to reach [place], you may choose to seek medical care at your doctor's office, retail clinic, urgent care center, or emergency room.  If you have a medical emergency, please immediately call 911 or go to the emergency department.  Pager Numbers  - Dr. Nehemiah Massed: [phone]  - Dr. Laurence Ferrari: [phone]  - Dr. Nicole Kindred: [phone]  In the event of inclement weather, please call our main line at [phone] for an update on the status of any delays or closures.  Dermatology Medication Tips: Please keep the boxes that topical medications come in in order to help keep track of the instructions about where and how to use these. Pharmacies typically print the medication instructions only on the boxes and not directly on the medication tubes.   If your medication is too expensive, please contact our office at [phone] option 4 or send [place] a message through [place].   We are unable to tell what your co-pay for medications will be in advance as this is different depending on your insurance coverage. However, we may be able to find a substitute medication at lower cost or fill out paperwork to get insurance to cover a needed  medication.   If a prior authorization is required to get your medication covered by your insurance company, please allow [place] 1-2 business days to complete this process.  Drug prices often vary depending on where the prescription is filled and some pharmacies may offer cheaper prices.  The website www.goodrx.com contains coupons for medications through different pharmacies. The prices here do not account for what the cost may be with help from insurance (it may be cheaper with your insurance), but the website can  give you the price if you did not use any insurance.  - You can print the associated coupon and take it with your prescription to the pharmacy.  - You may also stop by our office during regular business hours and pick up a GoodRx coupon card.  - If you need your prescription sent electronically to a different pharmacy, notify our office through [facility] or by phone at [phone] option 4.     Si Usted Necesita Algo Despus de Su Visita  Tambin puede enviarnos un mensaje a travs de Pharmacist, community. Por lo general respondemos a los mensajes de MyChart en el transcurso de 1 a 2 das hbiles.  Para renovar recetas, por favor pida a su farmacia que se ponga en contacto con nuestra oficina. Harland Dingwall de fax es [place] [phone].  Si tiene un asunto urgente cuando la clnica est cerrada y que no puede esperar hasta el siguiente da hbil, puede llamar/localizar a su doctor(a) al nmero que aparece a continuacin.   Por favor, tenga en cuenta que aunque hacemos todo lo posible para estar disponibles para asuntos urgentes fuera del horario de [place], no estamos disponibles las 24 horas del da, los 7 das de la [place].   Si tiene un problema urgente y no puede comunicarse con nosotros, puede optar por buscar atencin mdica  en el consultorio de su doctor(a), en una clnica privada, en un centro de atencin urgente o en una sala de emergencias.  Si tiene Engineering geologist, por favor llame inmediatamente al 911 o vaya a la sala de emergencias.  Nmeros de bper  - Dr. Nehemiah Massed: [phone]  - Dra. Moye: [phone]  - Dra. Nicole Kindred: [phone]  En caso de inclemencias del [place], por favor llame a Johnsie Kindred principal al [phone] para una actualizacin sobre el [place] de cualquier retraso o cierre.  Consejos para la medicacin en dermatologa: Por favor, guarde las cajas en las que vienen los medicamentos de uso tpico para ayudarle a seguir las instrucciones sobre  dnde y cmo usarlos. Las farmacias generalmente imprimen las instrucciones del medicamento slo en las cajas y no directamente en los tubos del [place].   Si su medicamento es muy caro, por favor, pngase en contacto con Zigmund Daniel llamando al [phone] y presione la opcin 4 o envenos un mensaje a travs de Pharmacist, community.   No podemos decirle cul ser su copago por los medicamentos por adelantado ya que esto es diferente dependiendo de la cobertura de su seguro. Sin embargo, es posible que podamos encontrar un medicamento sustituto a Electrical engineer un formulario para que el seguro cubra el medicamento que se considera necesario.   Si se requiere una autorizacin previa para que su compaa de seguros [place] su medicamento, por favor permtanos de 1 a 2 das hbiles para completar este proceso.  Los precios de los medicamentos varan con frecuencia dependiendo del Environmental consultant de dnde se surte la receta y alguna farmacias pueden ofrecer precios ms baratos.  El  sitio web www.goodrx.com tiene cupones para medicamentos de Airline pilot. Los precios aqu no tienen en cuenta lo que podra costar con la ayuda del seguro (puede ser ms barato con su seguro), pero el sitio web puede darle el precio si no utiliz Research scientist (physical sciences).  - Puede imprimir el cupn correspondiente y llevarlo con su receta a la farmacia.  - Tambin puede pasar por nuestra oficina durante el horario de atencin regular y Charity fundraiser una tarjeta de cupones de GoodRx.  - Si necesita que su receta se enve electrnicamente a una farmacia diferente, informe a nuestra oficina a travs de MyChart de [place] o por telfono llamando al [phone] y presione la opcin 4.

## 2022-04-04 ENCOUNTER — Ambulatory Visit
Admission: RE | Admit: 2022-04-04 | Discharge: 2022-04-04 | Disposition: A | Discharge status: Home / Self Care | Payer: Medicare Other | Source: Ambulatory Visit | Attending: Physician Assistant | Admitting: Physician Assistant

## 2022-04-04 DIAGNOSIS — N6489 Other specified disorders of breast: Secondary | ICD-10-CM | POA: Insufficient documentation

## 2022-04-09 ENCOUNTER — Other Ambulatory Visit: Payer: Self-pay | Admitting: Physician Assistant

## 2022-04-09 DIAGNOSIS — R928 Other abnormal and inconclusive findings on diagnostic imaging of breast: Secondary | ICD-10-CM

## 2022-04-09 DIAGNOSIS — N6489 Other specified disorders of breast: Secondary | ICD-10-CM

## 2022-04-18 ENCOUNTER — Ambulatory Visit
Admission: RE | Admit: 2022-04-18 | Discharge: 2022-04-18 | Disposition: A | Discharge status: Home / Self Care | Payer: Medicare Other | Source: Ambulatory Visit | Attending: Physician Assistant | Admitting: Physician Assistant

## 2022-04-18 DIAGNOSIS — R928 Other abnormal and inconclusive findings on diagnostic imaging of breast: Secondary | ICD-10-CM | POA: Diagnosis present

## 2022-04-18 DIAGNOSIS — N6489 Other specified disorders of breast: Secondary | ICD-10-CM | POA: Diagnosis present

## 2022-04-18 HISTORY — PX: BREAST BIOPSY: SHX20

## 2022-04-19 LAB — SURGICAL PATHOLOGY

## 2022-07-10 ENCOUNTER — Ambulatory Visit: Payer: Medicare Other | Admitting: Dermatology

## 2022-07-10 VITALS — BP 114/68 | HR 72

## 2022-07-10 DIAGNOSIS — L820 Inflamed seborrheic keratosis: Secondary | ICD-10-CM | POA: Diagnosis not present

## 2022-07-10 DIAGNOSIS — C44729 Squamous cell carcinoma of skin of left lower limb, including hip: Secondary | ICD-10-CM

## 2022-07-10 DIAGNOSIS — D485 Neoplasm of uncertain behavior of skin: Secondary | ICD-10-CM

## 2022-07-10 DIAGNOSIS — L578 Other skin changes due to chronic exposure to nonionizing radiation: Secondary | ICD-10-CM

## 2022-07-10 NOTE — Patient Instructions (Addendum)
Wound Care Instructions  Cleanse wound gently with soap and water once a day then pat dry with clean gauze. Apply a thin coat of Petrolatum (petroleum jelly, "Vaseline") over the wound (unless you have an allergy to this). We recommend that you use a new, sterile tube of Vaseline. Do not pick or remove scabs. Do not remove the yellow or white "healing tissue" from the base of the wound.  Cover the wound with fresh, clean, nonstick gauze and secure with paper tape. You may use Band-Aids in place of gauze and tape if the wound is small enough, but would recommend trimming much of the tape off as there is often too much. Sometimes Band-Aids can irritate the skin.  You should call the office for your biopsy report after 1 week if you have not already been contacted.  If you experience any problems, such as abnormal amounts of bleeding, swelling, significant bruising, significant pain, or evidence of infection, please call the office immediately.  FOR ADULT SURGERY PATIENTS: If you need something for pain relief you may take 1 extra strength Tylenol (acetaminophen) AND 2 Ibuprofen (200mg each) together every 4 hours as needed for pain. (do not take these if you are allergic to them or if you have a reason you should not take them.) Typically, you may only need pain medication for 1 to 3 days.     Due to recent changes in healthcare laws, you may see results of your pathology and/or laboratory studies on MyChart before the doctors have had a chance to review them. We understand that in some cases there may be results that are confusing or concerning to you. Please understand that not all results are received at the same time and often the doctors may need to interpret multiple results in order to provide you with the best plan of care or course of treatment. Therefore, we ask that you please give us 2 business days to thoroughly review all your results before contacting the office for clarification. Should  we see a critical lab result, you will be contacted sooner.   If You Need Anything After Your Visit  If you have any questions or concerns for your doctor, please call our main line at [phone] and press option 4 to reach your doctor's medical assistant. If no one answers, please leave a voicemail as directed and we will return your call as soon as possible. Messages left after 4 pm will be answered the following business day.   You may also send us a message via MyChart. We typically respond to MyChart messages within 1-2 business days.  For prescription refills, please ask your pharmacy to contact our office. Our fax number is [phone].  If you have an urgent issue when the clinic is closed that cannot wait until the next business day, you can page your doctor at the number below.    Please note that while we do our best to be available for urgent issues outside of office hours, we are not available 24/7.   If you have an urgent issue and are unable to reach us, you may choose to seek medical care at your doctor's office, retail clinic, urgent care center, or emergency room.  If you have a medical emergency, please immediately call 911 or go to the emergency department.  Pager Numbers  - Dr. Kowalski: [phone]  - Dr. Moye: [phone]  - Dr. Stewart: [phone]  In the event of inclement weather, please call our main line at   [phone] for an update on the status of any delays or closures.  Dermatology Medication Tips: Please keep the boxes that topical medications come in in order to help keep track of the instructions about where and how to use these. Pharmacies typically print the medication instructions only on the boxes and not directly on the medication tubes.   If your medication is too expensive, please contact our office at [phone] option 4 or send us a message through MyChart.   We are unable to tell what your co-pay for medications will be in  advance as this is different depending on your insurance coverage. However, we may be able to find a substitute medication at lower cost or fill out paperwork to get insurance to cover a needed medication.   If a prior authorization is required to get your medication covered by your insurance company, please allow us 1-2 business days to complete this process.  Drug prices often vary depending on where the prescription is filled and some pharmacies may offer cheaper prices.  The website www.goodrx.com contains coupons for medications through different pharmacies. The prices here do not account for what the cost may be with help from insurance (it may be cheaper with your insurance), but the website can give you the price if you did not use any insurance.  - You can print the associated coupon and take it with your prescription to the pharmacy.  - You may also stop by our office during regular business hours and pick up a GoodRx coupon card.  - If you need your prescription sent electronically to a different pharmacy, notify our office through [facility] MyChart or by phone at [phone] option 4.     Si Usted Necesita Algo Despus de Su Visita  Tambin puede enviarnos un mensaje a travs de MyChart. Por lo general respondemos a los mensajes de MyChart en el transcurso de 1 a 2 das hbiles.  Para renovar recetas, por favor pida a su farmacia que se ponga en contacto con nuestra oficina. Nuestro nmero de fax es el [phone].  Si tiene un asunto urgente cuando la clnica est cerrada y que no puede esperar hasta el siguiente da hbil, puede llamar/localizar a su doctor(a) al nmero que aparece a continuacin.   Por favor, tenga en cuenta que aunque hacemos todo lo posible para estar disponibles para asuntos urgentes fuera del horario de oficina, no estamos disponibles las 24 horas del da, los 7 das de la semana.   Si tiene un problema urgente y no puede comunicarse con nosotros, puede  optar por buscar atencin mdica  en el consultorio de su doctor(a), en una clnica privada, en un centro de atencin urgente o en una sala de emergencias.  Si tiene una emergencia mdica, por favor llame inmediatamente al 911 o vaya a la sala de emergencias.  Nmeros de bper  - Dr. Kowalski: [phone]  - Dra. Moye: [phone]  - Dra. Stewart: [phone]  En caso de inclemencias del tiempo, por favor llame a nuestra lnea principal al [phone] para una actualizacin sobre el estado de cualquier retraso o cierre.  Consejos para la medicacin en dermatologa: Por favor, guarde las cajas en las que vienen los medicamentos de uso tpico para ayudarle a seguir las instrucciones sobre dnde y cmo usarlos. Las farmacias generalmente imprimen las instrucciones del medicamento slo en las cajas y no directamente en los tubos del medicamento.   Si su medicamento es muy caro, por favor, pngase en contacto con   nuestra oficina llamando al [phone] y presione la opcin 4 o envenos un mensaje a travs de MyChart.   No podemos decirle cul ser su copago por los medicamentos por adelantado ya que esto es diferente dependiendo de la cobertura de su seguro. Sin embargo, es posible que podamos encontrar un medicamento sustituto a menor costo o llenar un formulario para que el seguro cubra el medicamento que se considera necesario.   Si se requiere una autorizacin previa para que su compaa de seguros cubra su medicamento, por favor permtanos de 1 a 2 das hbiles para completar este proceso.  Los precios de los medicamentos varan con frecuencia dependiendo del lugar de dnde se surte la receta y alguna farmacias pueden ofrecer precios ms baratos.  El sitio web www.goodrx.com tiene cupones para medicamentos de diferentes farmacias. Los precios aqu no tienen en cuenta lo que podra costar con la ayuda del seguro (puede ser ms barato con su seguro), pero el sitio web puede darle el  precio si no utiliz ningn seguro.  - Puede imprimir el cupn correspondiente y llevarlo con su receta a la farmacia.  - Tambin puede pasar por nuestra oficina durante el horario de atencin regular y recoger una tarjeta de cupones de GoodRx.  - Si necesita que su receta se enve electrnicamente a una farmacia diferente, informe a nuestra oficina a travs de MyChart de [facility] o por telfono llamando al [phone] y presione la opcin 4.  

## 2022-07-10 NOTE — Progress Notes (Signed)
   Follow-Up Visit   Subjective  Jennifer Gardner is a 67 y.o. female who presents for the following: growths (Left lower leg. One area was treated with cryotherapy at last visit, but didn't go away. Area is now sore and feels like a knot. ). She has a new spot that has come up and is irritated.   The following portions of the chart were reviewed this encounter and updated as appropriate:       Review of Systems:  No other skin or systemic complaints except as noted in HPI or Assessment and Plan.  Objective  Well appearing patient in no apparent distress; mood and affect are within normal limits.  A focused examination was performed including face, left lower leg. Relevant physical exam findings are noted in the Assessment and Plan.  left medial lower leg 1.0 CM keratotic papule         Assessment & Plan  Actinic Damage - chronic, secondary to cumulative UV radiation exposure/sun exposure over time - diffuse scaly erythematous macules with underlying dyspigmentation - Recommend daily broad spectrum sunscreen SPF 30+ to sun-exposed areas, reapply every 2 hours as needed.  - Recommend staying in the shade or wearing long sleeves, sun glasses (UVA+UVB protection) and wide brim hats (4-inch brim around the entire circumference of the hat). - Call for new or changing lesions.  Neoplasm of uncertain behavior of skin left medial lower leg  Skin / nail biopsy Type of biopsy: tangential   Informed consent: discussed and consent obtained   Patient was prepped and draped in usual sterile fashion: Area prepped with alcohol. Anesthesia: the lesion was anesthetized in a standard fashion   Anesthetic:  1% lidocaine w/ epinephrine 1-100,000 buffered w/ 8.4% NaHCO3 Instrument used: flexible razor blade   Hemostasis achieved with: pressure, aluminum chloride and electrodesiccation   Outcome: patient tolerated procedure well    Destruction of lesion  Destruction method:  electrodesiccation and curettage   Informed consent: discussed and consent obtained   Curettage performed in three different directions: Yes   Electrodesiccation performed over the curetted area: Yes   Final wound size (cm):  1.1 Hemostasis achieved with:  pressure, aluminum chloride and electrodesiccation Outcome: patient tolerated procedure well with no complications   Post-procedure details: wound care instructions given   Post-procedure details comment:  Ointment and bandage applied.  Specimen 1 - Surgical pathology Differential Diagnosis: Inflamed SK r/o SCC Check Margins: No EDC today  Inflamed seborrheic keratosis L upper pretibia  Symptomatic, irritating, patient would like treated.  Destruction of lesion - L upper pretibia  Destruction method: cryotherapy   Informed consent: discussed and consent obtained   Lesion destroyed using liquid nitrogen: Yes   Region frozen until ice ball extended beyond lesion: Yes   Outcome: patient tolerated procedure well with no complications   Post-procedure details: wound care instructions given   Additional details:  Prior to procedure, discussed risks of blister formation, small wound, skin dyspigmentation, or rare scar following cryotherapy. Recommend Vaseline ointment to treated areas while healing.    Return as scheduled, for TBSE.  IJamesetta Orleans, CMA, am acting as scribe for Brendolyn Patty, MD .  Documentation: I have reviewed the above documentation for accuracy and completeness, and I agree with the above.  Brendolyn Patty MD

## 2022-07-15 ENCOUNTER — Telehealth: Payer: Self-pay

## 2022-07-15 NOTE — Telephone Encounter (Signed)
Left pt msg to call for bx result/sh °

## 2022-07-15 NOTE — Telephone Encounter (Signed)
-----  Message from Brendolyn Patty, MD sent at 07/15/2022  3:42 PM EST ----- Skin , left medial lower leg WELL DIFFERENTIATED SQUAMOUS CELL CARCINOMA  SCC skin cancer- already treated with EDC at time of biopsy    - please call patient

## 2022-07-16 ENCOUNTER — Telehealth: Payer: Self-pay

## 2022-07-16 NOTE — Telephone Encounter (Signed)
Advised pt of bx results/sh ?

## 2022-07-16 NOTE — Telephone Encounter (Signed)
-----  Message from Brendolyn Patty, MD sent at 07/15/2022  3:42 PM EST ----- Skin , left medial lower leg WELL DIFFERENTIATED SQUAMOUS CELL CARCINOMA  SCC skin cancer- already treated with EDC at time of biopsy    - please call patient

## 2022-09-30 ENCOUNTER — Ambulatory Visit: Payer: Medicare Other | Admitting: Dermatology

## 2022-09-30 VITALS — BP 120/72 | HR 70

## 2022-09-30 DIAGNOSIS — D229 Melanocytic nevi, unspecified: Secondary | ICD-10-CM | POA: Diagnosis not present

## 2022-09-30 DIAGNOSIS — C44729 Squamous cell carcinoma of skin of left lower limb, including hip: Secondary | ICD-10-CM | POA: Diagnosis not present

## 2022-09-30 DIAGNOSIS — L814 Other melanin hyperpigmentation: Secondary | ICD-10-CM

## 2022-09-30 DIAGNOSIS — L570 Actinic keratosis: Secondary | ICD-10-CM | POA: Diagnosis not present

## 2022-09-30 DIAGNOSIS — D492 Neoplasm of unspecified behavior of bone, soft tissue, and skin: Secondary | ICD-10-CM

## 2022-09-30 DIAGNOSIS — L565 Disseminated superficial actinic porokeratosis (DSAP): Secondary | ICD-10-CM

## 2022-09-30 DIAGNOSIS — L578 Other skin changes due to chronic exposure to nonionizing radiation: Secondary | ICD-10-CM | POA: Diagnosis not present

## 2022-09-30 NOTE — Patient Instructions (Addendum)
Cryotherapy Aftercare  Wash gently with soap and water everyday.   Apply Vaseline and Band-Aid daily until healed.   Wound Care Instructions  Cleanse wound gently with soap and water once a day then pat dry with clean gauze. Apply a thin coat of Petrolatum (petroleum jelly, "Vaseline") over the wound (unless you have an allergy to this). We recommend that you use a new, sterile tube of Vaseline. Do not pick or remove scabs. Do not remove the yellow or white "healing tissue" from the base of the wound.  Cover the wound with fresh, clean, nonstick gauze and secure with paper tape. You may use Band-Aids in place of gauze and tape if the wound is small enough, but would recommend trimming much of the tape off as there is often too much. Sometimes Band-Aids can irritate the skin.  You should call the office for your biopsy report after 1 week if you have not already been contacted.  If you experience any problems, such as abnormal amounts of bleeding, swelling, significant bruising, significant pain, or evidence of infection, please call the office immediately.  FOR ADULT SURGERY PATIENTS: If you need something for pain relief you may take 1 extra strength Tylenol (acetaminophen) AND 2 Ibuprofen (200mg each) together every 4 hours as needed for pain. (do not take these if you are allergic to them or if you have a reason you should not take them.) Typically, you may only need pain medication for 1 to 3 days.      Recommend daily broad spectrum sunscreen SPF 30+ to sun-exposed areas, reapply every 2 hours as needed. Call for new or changing lesions.  Staying in the shade or wearing long sleeves, sun glasses (UVA+UVB protection) and wide brim hats (4-inch brim around the entire circumference of the hat) are also recommended for sun protection.    Due to recent changes in healthcare laws, you may see results of your pathology and/or laboratory studies on MyChart before the doctors have had a chance to  review them. We understand that in some cases there may be results that are confusing or concerning to you. Please understand that not all results are received at the same time and often the doctors may need to interpret multiple results in order to provide you with the best plan of care or course of treatment. Therefore, we ask that you please give us 2 business days to thoroughly review all your results before contacting the office for clarification. Should we see a critical lab result, you will be contacted sooner.   If You Need Anything After Your Visit  If you have any questions or concerns for your doctor, please call our main line at [phone] and press option 4 to reach your doctor's medical assistant. If no one answers, please leave a voicemail as directed and we will return your call as soon as possible. Messages left after 4 pm will be answered the following business day.   You may also send us a message via MyChart. We typically respond to MyChart messages within 1-2 business days.  For prescription refills, please ask your pharmacy to contact our office. Our fax number is [phone].  If you have an urgent issue when the clinic is closed that cannot wait until the next business day, you can page your doctor at the number below.    Please note that while we do our best to be available for urgent issues outside of office hours, we are not available 24/7.     If you have an urgent issue and are unable to reach us, you may choose to seek medical care at your doctor's office, retail clinic, urgent care center, or emergency room.  If you have a medical emergency, please immediately call 911 or go to the emergency department.  Pager Numbers  - Dr. Kowalski: [phone]  - Dr. Moye: [phone]  - Dr. Stewart: [phone]  In the event of inclement weather, please call our main line at [phone] for an update on the status of any delays or closures.  Dermatology Medication  Tips: Please keep the boxes that topical medications come in in order to help keep track of the instructions about where and how to use these. Pharmacies typically print the medication instructions only on the boxes and not directly on the medication tubes.   If your medication is too expensive, please contact our office at [phone] option 4 or send us a message through MyChart.   We are unable to tell what your co-pay for medications will be in advance as this is different depending on your insurance coverage. However, we may be able to find a substitute medication at lower cost or fill out paperwork to get insurance to cover a needed medication.   If a prior authorization is required to get your medication covered by your insurance company, please allow us 1-2 business days to complete this process.  Drug prices often vary depending on where the prescription is filled and some pharmacies may offer cheaper prices.  The website www.goodrx.com contains coupons for medications through different pharmacies. The prices here do not account for what the cost may be with help from insurance (it may be cheaper with your insurance), but the website can give you the price if you did not use any insurance.  - You can print the associated coupon and take it with your prescription to the pharmacy.  - You may also stop by our office during regular business hours and pick up a GoodRx coupon card.  - If you need your prescription sent electronically to a different pharmacy, notify our office through [facility] MyChart or by phone at [phone] option 4.     Si Usted Necesita Algo Despus de Su Visita  Tambin puede enviarnos un mensaje a travs de MyChart. Por lo general respondemos a los mensajes de MyChart en el transcurso de 1 a 2 das hbiles.  Para renovar recetas, por favor pida a su farmacia que se ponga en contacto con nuestra oficina. Nuestro nmero de fax es el [phone].  Si tiene un  asunto urgente cuando la clnica est cerrada y que no puede esperar hasta el siguiente da hbil, puede llamar/localizar a su doctor(a) al nmero que aparece a continuacin.   Por favor, tenga en cuenta que aunque hacemos todo lo posible para estar disponibles para asuntos urgentes fuera del horario de oficina, no estamos disponibles las 24 horas del da, los 7 das de la semana.   Si tiene un problema urgente y no puede comunicarse con nosotros, puede optar por buscar atencin mdica  en el consultorio de su doctor(a), en una clnica privada, en un centro de atencin urgente o en una sala de emergencias.  Si tiene una emergencia mdica, por favor llame inmediatamente al 911 o vaya a la sala de emergencias.  Nmeros de bper  - Dr. Kowalski: [phone]  - Dra. Moye: [phone]  - Dra. Stewart: [phone]  En caso de inclemencias del tiempo, por favor llame a nuestra lnea principal   al [phone] para una actualizacin sobre el estado de cualquier retraso o cierre.  Consejos para la medicacin en dermatologa: Por favor, guarde las cajas en las que vienen los medicamentos de uso tpico para ayudarle a seguir las instrucciones sobre dnde y cmo usarlos. Las farmacias generalmente imprimen las instrucciones del medicamento slo en las cajas y no directamente en los tubos del medicamento.   Si su medicamento es muy caro, por favor, pngase en contacto con nuestra oficina llamando al [phone] y presione la opcin 4 o envenos un mensaje a travs de MyChart.   No podemos decirle cul ser su copago por los medicamentos por adelantado ya que esto es diferente dependiendo de la cobertura de su seguro. Sin embargo, es posible que podamos encontrar un medicamento sustituto a menor costo o llenar un formulario para que el seguro cubra el medicamento que se considera necesario.   Si se requiere una autorizacin previa para que su compaa de seguros cubra su medicamento, por favor  permtanos de 1 a 2 das hbiles para completar este proceso.  Los precios de los medicamentos varan con frecuencia dependiendo del lugar de dnde se surte la receta y alguna farmacias pueden ofrecer precios ms baratos.  El sitio web www.goodrx.com tiene cupones para medicamentos de diferentes farmacias. Los precios aqu no tienen en cuenta lo que podra costar con la ayuda del seguro (puede ser ms barato con su seguro), pero el sitio web puede darle el precio si no utiliz ningn seguro.  - Puede imprimir el cupn correspondiente y llevarlo con su receta a la farmacia.  - Tambin puede pasar por nuestra oficina durante el horario de atencin regular y recoger una tarjeta de cupones de GoodRx.  - Si necesita que su receta se enve electrnicamente a una farmacia diferente, informe a nuestra oficina a travs de MyChart de [facility] o por telfono llamando al [phone] y presione la opcin 4.  

## 2022-09-30 NOTE — Progress Notes (Signed)
Follow-Up Visit   Subjective  Jennifer Gardner is a 67 y.o. female who presents for the following: Actinic keratosis. Arms, shoulders. New spot beside most recent SCC at left medial lower leg.  The patient has spots, moles and lesions to be evaluated, some may be new or changing and the patient has concerns that these could be cancer. Especially sore scaly spots on left lower leg.  The following portions of the chart were reviewed this encounter and updated as appropriate: medications, allergies, medical history  Review of Systems:  No other skin or systemic complaints except as noted in HPI or Assessment and Plan.  Objective  Well appearing patient in no apparent distress; mood and affect are within normal limits.  A focused examination was performed of the following areas: Face, arms, hands, chest, back  Relevant exam findings are noted in the Assessment and Plan.  left medial lower leg 4 mm violaceous scaly papule at edge of scar       Assessment & Plan   DSAP (disseminated superficial actinic porokeratosis) bil arms, bil legs   Chronic and persistent condition with duration or expected duration over one year. Condition is symptomatic/ bothersome to patient. Not currently at goal.  Some improvement with SM cream as below.   DSAP is a chronic inherited condition of sun-exposed skin, most commonly affecting the arms and legs.  It is difficult to treat.  Recommend photoprotection and regular use of spf 30 or higher sunscreen to prevent worsening of condition and precancerous changes.   Continue SM Cholesterol 2% Lovastatin 2% Cream 240 gm- Apply twice daily as directed to affected areas arms and legs.    Neoplasm of skin left medial lower leg  Epidermal / dermal shaving  Lesion diameter (cm):  0.6 Informed consent: discussed and consent obtained   Patient was prepped and draped in usual sterile fashion: Area prepped with alcohol. Anesthesia: the lesion was  anesthetized in a standard fashion   Anesthetic:  1% lidocaine w/ epinephrine 1-100,000 buffered w/ 8.4% NaHCO3 Instrument used: flexible razor blade   Hemostasis achieved with: pressure, aluminum chloride and electrodesiccation   Outcome: patient tolerated procedure well   Post-procedure details: wound care instructions given   Post-procedure details comment:  Ointment and small bandage applied  Specimen 1 - Surgical pathology Differential Diagnosis: ISK vs recurrent SCC  Check Margins: Yes  Previous pathology: [id number]  Actinic skin damage  Nevus  Lentigo  DSAP (disseminated superficial actinic porokeratosis)  Related Medications Cholesterol POWD 1 Application by Does not apply route in the morning and at bedtime. Bid to aa arms and legs  HYPERTROPHIC AK VS INFLAMED SEBORRHEIC KERATOSIS Exam: Erythematous keratotic papules at L lower leg   Symptomatic, irritating, patient would like treated.  Actinic keratoses are precancerous spots that appear secondary to cumulative UV radiation exposure/sun exposure over time. They are chronic with expected duration over 1 year. A portion of actinic keratoses will progress to squamous cell carcinoma of the skin. It is not possible to reliably predict which spots will progress to skin cancer and so treatment is recommended to prevent development of skin cancer.  Recommend daily broad spectrum sunscreen SPF 30+ to sun-exposed areas, reapply every 2 hours as needed.  Recommend staying in the shade or wearing long sleeves, sun glasses (UVA+UVB protection) and wide brim hats (4-inch brim around the entire circumference of the hat). Call for new or changing lesions.   Prior to procedure, discussed risks of blister formation, small wound, skin dyspigmentation,  or rare scar following treatment. Recommend Vaseline ointment to treated areas while healing.  Destruction Procedure Note Destruction method: cryotherapy   Informed consent: discussed  and consent obtained   Lesion destroyed using liquid nitrogen: Yes   Outcome: patient tolerated procedure well with no complications   Post-procedure details: wound care instructions given   Locations: left leg x7 # of Lesions Treated: 7  MELANOCYTIC NEVI Exam: Tan-brown and/or pink-flesh-colored symmetric macules and papules  Treatment Plan: Benign appearing on exam today. Recommend observation. Call clinic for new or changing moles. Recommend daily use of broad spectrum spf 30+ sunscreen to sun-exposed areas.      LENTIGINES Exam: scattered tan macules Due to sun exposure Treatment Plan: Benign-appearing, observe. Recommend daily broad spectrum sunscreen SPF 30+ to sun-exposed areas, reapply every 2 hours as needed.  Call for any changes  ACTINIC DAMAGE - chronic, secondary to cumulative UV radiation exposure/sun exposure over time - diffuse scaly erythematous macules with underlying dyspigmentation - Recommend daily broad spectrum sunscreen SPF 30+ to sun-exposed areas, reapply every 2 hours as needed.  - Recommend staying in the shade or wearing long sleeves, sun glasses (UVA+UVB protection) and wide brim hats (4-inch brim around the entire circumference of the hat). - Call for new or changing lesions.   Return in about 6 months (around 04/01/2023) for TBSE, [place] Follow Up.  I, Lawson Radar, CMA, am acting as scribe for [name], MD.   Documentation: I have reviewed the above documentation for accuracy and completeness, and I agree with the above.  [name], MD

## 2022-10-01 ENCOUNTER — Ambulatory Visit: Payer: Medicare Other | Admitting: Dermatology

## 2022-10-07 ENCOUNTER — Telehealth: Payer: Self-pay

## 2022-10-07 NOTE — Telephone Encounter (Signed)
Left message for patient to call for biopsy results. SCC skin cancer.  [facility] has been contacted to check margins. If clear then, no further treatment needed.  If positive, then will need EDC.

## 2022-10-08 ENCOUNTER — Telehealth: Payer: Self-pay

## 2022-10-08 NOTE — Telephone Encounter (Signed)
Patient advised and knows to wait for clarification on margins. aw

## 2022-10-08 NOTE — Telephone Encounter (Signed)
-----   Message from [name], MD sent at 10/07/2022  2:22 PM EDT ----- Skin , left medial lower leg WELL DIFFERENTIATED SQUAMOUS CELL CARCINOMA WITH SUPERFICIAL INFILTRATION  SCC skin cancer.  Please contact [facility] to see if shave removal margins were clear.  If clear then, no further treatment needed.  If positive, then will need EDC   - please call patient

## 2022-10-09 ENCOUNTER — Telehealth: Payer: Self-pay

## 2022-10-09 NOTE — Telephone Encounter (Signed)
-----   Message from [name], MD sent at 10/08/2022  8:28 PM EDT ----- SCC touches deep margin, so will need EDC - please call patient

## 2022-10-09 NOTE — Telephone Encounter (Signed)
Advised pt of bx results.  Scheduled pt for EDC/sh 

## 2022-10-30 ENCOUNTER — Ambulatory Visit: Payer: Medicare Other | Admitting: Dermatology

## 2022-10-30 ENCOUNTER — Encounter: Payer: Self-pay | Admitting: Dermatology

## 2022-10-30 VITALS — BP 112/73 | HR 66

## 2022-10-30 DIAGNOSIS — C44729 Squamous cell carcinoma of skin of left lower limb, including hip: Secondary | ICD-10-CM

## 2022-10-30 DIAGNOSIS — L821 Other seborrheic keratosis: Secondary | ICD-10-CM

## 2022-10-30 DIAGNOSIS — W908XXA Exposure to other nonionizing radiation, initial encounter: Secondary | ICD-10-CM

## 2022-10-30 DIAGNOSIS — C4492 Squamous cell carcinoma of skin, unspecified: Secondary | ICD-10-CM

## 2022-10-30 DIAGNOSIS — L578 Other skin changes due to chronic exposure to nonionizing radiation: Secondary | ICD-10-CM

## 2022-10-30 DIAGNOSIS — D485 Neoplasm of uncertain behavior of skin: Secondary | ICD-10-CM

## 2022-10-30 DIAGNOSIS — X32XXXA Exposure to sunlight, initial encounter: Secondary | ICD-10-CM

## 2022-10-30 NOTE — Patient Instructions (Addendum)
Wound Care Instructions  Cleanse wound gently with soap and water once a day then pat dry with clean gauze. Apply a thin coat of Petrolatum (petroleum jelly, "Vaseline") over the wound (unless you have an allergy to this). We recommend that you use a new, sterile tube of Vaseline. Do not pick or remove scabs. Do not remove the yellow or white "healing tissue" from the base of the wound.  Cover the wound with fresh, clean, nonstick gauze and secure with paper tape. You may use Band-Aids in place of gauze and tape if the wound is small enough, but would recommend trimming much of the tape off as there is often too much. Sometimes Band-Aids can irritate the skin.  You should call the office for your biopsy report after 1 week if you have not already been contacted.  If you experience any problems, such as abnormal amounts of bleeding, swelling, significant bruising, significant pain, or evidence of infection, please call the office immediately.  FOR ADULT SURGERY PATIENTS: If you need something for pain relief you may take 1 extra strength Tylenol (acetaminophen) AND 2 Ibuprofen (200mg each) together every 4 hours as needed for pain. (do not take these if you are allergic to them or if you have a reason you should not take them.) Typically, you may only need pain medication for 1 to 3 days.     Due to recent changes in healthcare laws, you may see results of your pathology and/or laboratory studies on MyChart before the doctors have had a chance to review them. We understand that in some cases there may be results that are confusing or concerning to you. Please understand that not all results are received at the same time and often the doctors may need to interpret multiple results in order to provide you with the best plan of care or course of treatment. Therefore, we ask that you please give us 2 business days to thoroughly review all your results before contacting the office for clarification. Should  we see a critical lab result, you will be contacted sooner.   If You Need Anything After Your Visit  If you have any questions or concerns for your doctor, please call our main line at [phone] and press option 4 to reach your doctor's medical assistant. If no one answers, please leave a voicemail as directed and we will return your call as soon as possible. Messages left after 4 pm will be answered the following business day.   You may also send us a message via MyChart. We typically respond to MyChart messages within 1-2 business days.  For prescription refills, please ask your pharmacy to contact our office. Our fax number is [phone].  If you have an urgent issue when the clinic is closed that cannot wait until the next business day, you can page your doctor at the number below.    Please note that while we do our best to be available for urgent issues outside of office hours, we are not available 24/7.   If you have an urgent issue and are unable to reach us, you may choose to seek medical care at your doctor's office, retail clinic, urgent care center, or emergency room.  If you have a medical emergency, please immediately call 911 or go to the emergency department.  Pager Numbers  - Dr. Kowalski: [phone]  - Dr. Moye: [phone]  - Dr. Stewart: [phone]  In the event of inclement weather, please call our main line at   [phone] for an update on the status of any delays or closures.  Dermatology Medication Tips: Please keep the boxes that topical medications come in in order to help keep track of the instructions about where and how to use these. Pharmacies typically print the medication instructions only on the boxes and not directly on the medication tubes.   If your medication is too expensive, please contact our office at [phone] option 4 or send us a message through MyChart.   We are unable to tell what your co-pay for medications will be in  advance as this is different depending on your insurance coverage. However, we may be able to find a substitute medication at lower cost or fill out paperwork to get insurance to cover a needed medication.   If a prior authorization is required to get your medication covered by your insurance company, please allow us 1-2 business days to complete this process.  Drug prices often vary depending on where the prescription is filled and some pharmacies may offer cheaper prices.  The website www.goodrx.com contains coupons for medications through different pharmacies. The prices here do not account for what the cost may be with help from insurance (it may be cheaper with your insurance), but the website can give you the price if you did not use any insurance.  - You can print the associated coupon and take it with your prescription to the pharmacy.  - You may also stop by our office during regular business hours and pick up a GoodRx coupon card.  - If you need your prescription sent electronically to a different pharmacy, notify our office through [facility] MyChart or by phone at [phone] option 4.     Si Usted Necesita Algo Despus de Su Visita  Tambin puede enviarnos un mensaje a travs de MyChart. Por lo general respondemos a los mensajes de MyChart en el transcurso de 1 a 2 das hbiles.  Para renovar recetas, por favor pida a su farmacia que se ponga en contacto con nuestra oficina. Nuestro nmero de fax es el [phone].  Si tiene un asunto urgente cuando la clnica est cerrada y que no puede esperar hasta el siguiente da hbil, puede llamar/localizar a su doctor(a) al nmero que aparece a continuacin.   Por favor, tenga en cuenta que aunque hacemos todo lo posible para estar disponibles para asuntos urgentes fuera del horario de oficina, no estamos disponibles las 24 horas del da, los 7 das de la semana.   Si tiene un problema urgente y no puede comunicarse con nosotros, puede  optar por buscar atencin mdica  en el consultorio de su doctor(a), en una clnica privada, en un centro de atencin urgente o en una sala de emergencias.  Si tiene una emergencia mdica, por favor llame inmediatamente al 911 o vaya a la sala de emergencias.  Nmeros de bper  - Dr. Kowalski: [phone]  - Dra. Moye: [phone]  - Dra. Stewart: [phone]  En caso de inclemencias del tiempo, por favor llame a nuestra lnea principal al [phone] para una actualizacin sobre el estado de cualquier retraso o cierre.  Consejos para la medicacin en dermatologa: Por favor, guarde las cajas en las que vienen los medicamentos de uso tpico para ayudarle a seguir las instrucciones sobre dnde y cmo usarlos. Las farmacias generalmente imprimen las instrucciones del medicamento slo en las cajas y no directamente en los tubos del medicamento.   Si su medicamento es muy caro, por favor, pngase en contacto con   nuestra oficina llamando al [phone] y presione la opcin 4 o envenos un mensaje a travs de MyChart.   No podemos decirle cul ser su copago por los medicamentos por adelantado ya que esto es diferente dependiendo de la cobertura de su seguro. Sin embargo, es posible que podamos encontrar un medicamento sustituto a menor costo o llenar un formulario para que el seguro cubra el medicamento que se considera necesario.   Si se requiere una autorizacin previa para que su compaa de seguros cubra su medicamento, por favor permtanos de 1 a 2 das hbiles para completar este proceso.  Los precios de los medicamentos varan con frecuencia dependiendo del lugar de dnde se surte la receta y alguna farmacias pueden ofrecer precios ms baratos.  El sitio web www.goodrx.com tiene cupones para medicamentos de diferentes farmacias. Los precios aqu no tienen en cuenta lo que podra costar con la ayuda del seguro (puede ser ms barato con su seguro), pero el sitio web puede darle el  precio si no utiliz ningn seguro.  - Puede imprimir el cupn correspondiente y llevarlo con su receta a la farmacia.  - Tambin puede pasar por nuestra oficina durante el horario de atencin regular y recoger una tarjeta de cupones de GoodRx.  - Si necesita que su receta se enve electrnicamente a una farmacia diferente, informe a nuestra oficina a travs de MyChart de [facility] o por telfono llamando al [phone] y presione la opcin 4.  

## 2022-10-30 NOTE — Progress Notes (Signed)
   Follow-Up Visit   Subjective  Jennifer Gardner is a 67 y.o. female who presents for the following: SCC of the left medial lower leg, biopsy proven. Treat with EDC today. She also has another spot to check on the left lower leg. It is painful.  The patient has spots, moles and lesions to be evaluated, some may be new or changing and the patient has concerns that these could be cancer.   The following portions of the chart were reviewed this encounter and updated as appropriate: medications, allergies, medical history  Review of Systems:  No other skin or systemic complaints except as noted in HPI or Assessment and Plan.  Objective  Well appearing patient in no apparent distress; mood and affect are within normal limits.  A focused examination was performed of the following areas: Face, left leg  Relevant physical exam findings are noted in the Assessment and Plan.  Left Medial Lower Leg Pink biopsy site. No induration or scale  Left mid pretibia 1.5 CM pink scaly plaque       Assessment & Plan   Squamous cell carcinoma of skin Left Medial Lower Leg  Biopsy proven, 09/30/2022.  Appears clear today with biopsy. Observe for recurrence. If recurs, recommend EDC  Neoplasm of uncertain behavior of skin Left mid pretibia  Epidermal / dermal shaving  Lesion diameter (cm):  1.7 Informed consent: discussed and consent obtained   Patient was prepped and draped in usual sterile fashion: Area prepped with alcohol. Anesthesia: the lesion was anesthetized in a standard fashion   Anesthetic:  1% lidocaine w/ epinephrine 1-100,000 buffered w/ 8.4% NaHCO3 Instrument used: flexible razor blade   Hemostasis achieved with: pressure, aluminum chloride and electrodesiccation   Outcome: patient tolerated procedure well    Destruction of lesion  Destruction method: electrodesiccation and curettage   Informed consent: discussed and consent obtained   Curettage performed in three  different directions: Yes   Electrodesiccation performed over the curetted area: Yes   Final wound size (cm):  1.7 Hemostasis achieved with:  pressure, aluminum chloride and electrodesiccation Outcome: patient tolerated procedure well with no complications   Post-procedure details: wound care instructions given   Post-procedure details comment:  Ointment and bandage applied.  Specimen 1 - Surgical pathology Differential Diagnosis: Inflamed SK r/o SCC Check Margins: No EDC today  SEBORRHEIC KERATOSIS - Stuck-on, waxy, tan-brown papules and/or plaques, including left lateral thigh  - Benign-appearing - Discussed benign etiology and prognosis. - Observe - Call for any changes  ACTINIC DAMAGE - chronic, secondary to cumulative UV radiation exposure/sun exposure over time - diffuse scaly erythematous macules with underlying dyspigmentation - Recommend daily broad spectrum sunscreen SPF 30+ to sun-exposed areas, reapply every 2 hours as needed.  - Recommend staying in the shade or wearing long sleeves, sun glasses (UVA+UVB protection) and wide brim hats (4-inch brim around the entire circumference of the hat). - Call for new or changing lesions.   Return as scheduled.  ICherlyn Gardner, CMA, am acting as scribe for [name], MD .   Documentation: I have reviewed the above documentation for accuracy and completeness, and I agree with the above.  [name], MD

## 2022-11-04 ENCOUNTER — Telehealth: Payer: Self-pay

## 2022-11-04 NOTE — Telephone Encounter (Signed)
Advised pt of bx results/sh ?

## 2022-11-04 NOTE — Telephone Encounter (Signed)
-----   Message from [name], MD sent at 11/04/2022 12:59 PM EDT ----- Skin , left mid pretibia WELL DIFFERENTIATED SQUAMOUS CELL CARCINOMA WITH SUPERFICIAL INFILTRATION  SCC skin cancer- already treated with EDC at time of biopsy    - please call patient

## 2022-12-16 ENCOUNTER — Ambulatory Visit: Payer: Medicare Other | Admitting: Dermatology

## 2022-12-16 VITALS — BP 114/71

## 2022-12-16 DIAGNOSIS — C44722 Squamous cell carcinoma of skin of right lower limb, including hip: Secondary | ICD-10-CM | POA: Diagnosis not present

## 2022-12-16 DIAGNOSIS — L578 Other skin changes due to chronic exposure to nonionizing radiation: Secondary | ICD-10-CM

## 2022-12-16 DIAGNOSIS — Z85828 Personal history of other malignant neoplasm of skin: Secondary | ICD-10-CM

## 2022-12-16 DIAGNOSIS — W908XXA Exposure to other nonionizing radiation, initial encounter: Secondary | ICD-10-CM | POA: Diagnosis not present

## 2022-12-16 DIAGNOSIS — C44729 Squamous cell carcinoma of skin of left lower limb, including hip: Secondary | ICD-10-CM | POA: Diagnosis not present

## 2022-12-16 NOTE — Patient Instructions (Signed)
Due to recent changes in healthcare laws, you may see results of your pathology and/or laboratory studies on MyChart before the doctors have had a chance to review them. We understand that in some cases there may be results that are confusing or concerning to you. Please understand that not all results are received at the same time and often the doctors may need to interpret multiple results in order to provide you with the best plan of care or course of treatment. Therefore, we ask that you please give us 2 business days to thoroughly review all your results before contacting the office for clarification. Should we see a critical lab result, you will be contacted sooner.   If You Need Anything After Your Visit  If you have any questions or concerns for your doctor, please call our main line at [phone] and press option 4 to reach your doctor's medical assistant. If no one answers, please leave a voicemail as directed and we will return your call as soon as possible. Messages left after 4 pm will be answered the following business day.   You may also send us a message via MyChart. We typically respond to MyChart messages within 1-2 business days.  For prescription refills, please ask your pharmacy to contact our office. Our fax number is [phone].  If you have an urgent issue when the clinic is closed that cannot wait until the next business day, you can page your doctor at the number below.    Please note that while we do our best to be available for urgent issues outside of office hours, we are not available 24/7.   If you have an urgent issue and are unable to reach us, you may choose to seek medical care at your doctor's office, retail clinic, urgent care center, or emergency room.  If you have a medical emergency, please immediately call 911 or go to the emergency department.  Pager Numbers  - Dr. Kowalski: [phone]  - Dr. Moye: [phone]  - Dr. Stewart:  [phone]  In the event of inclement weather, please call our main line at [phone] for an update on the status of any delays or closures.  Dermatology Medication Tips: Please keep the boxes that topical medications come in in order to help keep track of the instructions about where and how to use these. Pharmacies typically print the medication instructions only on the boxes and not directly on the medication tubes.   If your medication is too expensive, please contact our office at [phone] option 4 or send us a message through MyChart.   We are unable to tell what your co-pay for medications will be in advance as this is different depending on your insurance coverage. However, we may be able to find a substitute medication at lower cost or fill out paperwork to get insurance to cover a needed medication.   If a prior authorization is required to get your medication covered by your insurance company, please allow us 1-2 business days to complete this process.  Drug prices often vary depending on where the prescription is filled and some pharmacies may offer cheaper prices.  The website www.goodrx.com contains coupons for medications through different pharmacies. The prices here do not account for what the cost may be with help from insurance (it may be cheaper with your insurance), but the website can give you the price if you did not use any insurance.  - You can print the associated coupon and take it with   your prescription to the pharmacy.  - You may also stop by our office during regular business hours and pick up a GoodRx coupon card.  - If you need your prescription sent electronically to a different pharmacy, notify our office through [facility] MyChart or by phone at [phone] option 4.     Si Usted Necesita Algo Despus de Su Visita  Tambin puede enviarnos un mensaje a travs de MyChart. Por lo general respondemos a los mensajes de MyChart en el transcurso de 1 a 2  das hbiles.  Para renovar recetas, por favor pida a su farmacia que se ponga en contacto con nuestra oficina. Nuestro nmero de fax es el [phone].  Si tiene un asunto urgente cuando la clnica est cerrada y que no puede esperar hasta el siguiente da hbil, puede llamar/localizar a su doctor(a) al nmero que aparece a continuacin.   Por favor, tenga en cuenta que aunque hacemos todo lo posible para estar disponibles para asuntos urgentes fuera del horario de oficina, no estamos disponibles las 24 horas del da, los 7 das de la semana.   Si tiene un problema urgente y no puede comunicarse con nosotros, puede optar por buscar atencin mdica  en el consultorio de su doctor(a), en una clnica privada, en un centro de atencin urgente o en una sala de emergencias.  Si tiene una emergencia mdica, por favor llame inmediatamente al 911 o vaya a la sala de emergencias.  Nmeros de bper  - Dr. Kowalski: [phone]  - Dra. Moye: [phone]  - Dra. Stewart: [phone]  En caso de inclemencias del tiempo, por favor llame a nuestra lnea principal al [phone] para una actualizacin sobre el estado de cualquier retraso o cierre.  Consejos para la medicacin en dermatologa: Por favor, guarde las cajas en las que vienen los medicamentos de uso tpico para ayudarle a seguir las instrucciones sobre dnde y cmo usarlos. Las farmacias generalmente imprimen las instrucciones del medicamento slo en las cajas y no directamente en los tubos del medicamento.   Si su medicamento es muy caro, por favor, pngase en contacto con nuestra oficina llamando al [phone] y presione la opcin 4 o envenos un mensaje a travs de MyChart.   No podemos decirle cul ser su copago por los medicamentos por adelantado ya que esto es diferente dependiendo de la cobertura de su seguro. Sin embargo, es posible que podamos encontrar un medicamento sustituto a menor costo o llenar un formulario para que el  seguro cubra el medicamento que se considera necesario.   Si se requiere una autorizacin previa para que su compaa de seguros cubra su medicamento, por favor permtanos de 1 a 2 das hbiles para completar este proceso.  Los precios de los medicamentos varan con frecuencia dependiendo del lugar de dnde se surte la receta y alguna farmacias pueden ofrecer precios ms baratos.  El sitio web www.goodrx.com tiene cupones para medicamentos de diferentes farmacias. Los precios aqu no tienen en cuenta lo que podra costar con la ayuda del seguro (puede ser ms barato con su seguro), pero el sitio web puede darle el precio si no utiliz ningn seguro.  - Puede imprimir el cupn correspondiente y llevarlo con su receta a la farmacia.  - Tambin puede pasar por nuestra oficina durante el horario de atencin regular y recoger una tarjeta de cupones de GoodRx.  - Si necesita que su receta se enve electrnicamente a una farmacia diferente, informe a nuestra oficina a travs de MyChart de [facility]   o por telfono llamando al [phone] y presione la opcin 4.  

## 2022-12-16 NOTE — Progress Notes (Signed)
   Follow-Up Visit   Subjective  Jennifer Gardner is a 67 y.o. female who presents for the following: check spots L lower leg, hx of SCC in that area treated with EDC, check spot R lower leg- new   The following portions of the chart were reviewed this encounter and updated as appropriate: medications, allergies, medical history  Review of Systems:  No other skin or systemic complaints except as noted in HPI or Assessment and Plan.  Objective  Well appearing patient in no apparent distress; mood and affect are within normal limits.   A focused examination was performed of the following areas: Lower legs  Relevant exam findings are noted in the Assessment and Plan.    Assessment & Plan   ACTINIC DAMAGE - chronic, secondary to cumulative UV radiation exposure/sun exposure over time - diffuse scaly erythematous macules with underlying dyspigmentation - Recommend daily broad spectrum sunscreen SPF 30+ to sun-exposed areas, reapply every 2 hours as needed.  - Recommend staying in the shade or wearing long sleeves, sun glasses (UVA+UVB protection) and wide brim hats (4-inch brim around the entire circumference of the hat). - Call for new or changing lesions.   SCC VS RECURRENT SCC VS OTHER Exam:  -L mid pretibia 1.9 x 0.9cm pink firm scaly paps appear as 2 adjacent - L medial lower leg sup medial at scar 0.7cm pink scaly pap - R pretibia 0.6cm pink scaly pap  Treatment Plan: Recommend malignant destruction of recurrent SCCs L lower leg and new SCC R lower leg with intralesional chemotherapy injections  IL 5FU (50mg /ml) Protocol for SCC/BCCs: After local anesthesia injection, inject ~0.6-1.0 ml (30-50 mg) 5FU intralesionally per lesion to blanch entire lesion, no more than 2.0 ml (100 mg) to larger SCCs, maximum 250 mg (5 ml) total amount per treatment session. Treat every 2-4 weeks until ulceration or clearance achieved.  Recommend excision of lesions that don't respond after 2  injections.  Potential systemic side effects of 5FU include:  Fatigue, diarrhea, mouth sores, eye irritation, HA, poor appetite    Procedure Note 5-FU Intralesional Injection  Location: L mid pretibia, L medial lower leg sup medial at scar, R pretibia  Informed Consent: Discussed risks (infection, pain, bleeding, bruising, ulceration, loss of skin pigment, lack of resolution, and recurrence of lesion) and benefits of the procedure, as well as the alternatives. Informed consent was obtained. Preparation: The area was prepared a standard fashion.  Anesthesia:Lidocaine 1% with epinephrine  Procedure Details: An intralesional injection was performed with 5-fluorouracil 50 mg/cc. 1 cc in total were injected, L mid pretibia 0.3cc to each pap, L medial lower leg sup medial at scar 0.3cc, R pretibia 0.1cc  NDC #: 46962-952-841 Exp: 11/24  Total number of injections: 4  Plan: The patient was instructed on post-op care. Recommend OTC analgesia as needed for pain.     Actinic skin damage  SCC (squamous cell carcinoma), leg, left  SCC (squamous cell carcinoma), leg, right    Return for 2-4 wks recheck legs, possible IL 5FU injections.  I, Jennifer Gardner, RMA, am acting as scribe for Jennifer Niece, MD .   Documentation: I have reviewed the above documentation for accuracy and completeness, and I agree with the above.  Jennifer Niece, MD

## 2023-01-01 ENCOUNTER — Ambulatory Visit: Payer: Medicare Other | Admitting: Dermatology

## 2023-01-01 ENCOUNTER — Encounter: Payer: Self-pay | Admitting: Dermatology

## 2023-01-01 VITALS — BP 130/71 | HR 77

## 2023-01-01 DIAGNOSIS — L578 Other skin changes due to chronic exposure to nonionizing radiation: Secondary | ICD-10-CM | POA: Diagnosis not present

## 2023-01-01 DIAGNOSIS — C44729 Squamous cell carcinoma of skin of left lower limb, including hip: Secondary | ICD-10-CM

## 2023-01-01 DIAGNOSIS — W908XXA Exposure to other nonionizing radiation, initial encounter: Secondary | ICD-10-CM

## 2023-01-01 NOTE — Progress Notes (Signed)
   Follow-Up Visit   Subjective  Jennifer Gardner is a 67 y.o. female who presents for the following: recheck legs, hx of scc, injected with 5 f/u injection reports she did not have a lot of reaction after injections.    The following portions of the chart were reviewed this encounter and updated as appropriate: medications, allergies, medical history  Review of Systems:  No other skin or systemic complaints except as noted in HPI or Assessment and Plan.  Objective  Well appearing patient in no apparent distress; mood and affect are within normal limits.   A focused examination was performed of the following areas: B/l legs   Relevant exam findings are noted in the Assessment and Plan.           Assessment & Plan   ACTINIC DAMAGE - chronic, secondary to cumulative UV radiation exposure/sun exposure over time - diffuse scaly erythematous macules with underlying dyspigmentation - Recommend daily broad spectrum sunscreen SPF 30+ to sun-exposed areas, reapply every 2 hours as needed.  - Recommend staying in the shade or wearing long sleeves, sun glasses (UVA+UVB protection) and wide brim hats (4-inch brim around the entire circumference of the hat). - Call for new or changing lesions.    SCC VS RECURRENT SCC VS OTHER Exam: L mid pretibia, pink scaly papule 0.6 cm and plaque 2 cm, see photo- outlined areas treated today    Treatment Plan: Recommend malignant destruction of recurrent SCCs L lower leg with intralesional chemotherapy injections.   SCC R lower leg has resolved with treatment.   IL 5FU (50mg /ml) Protocol for SCC/BCCs: After local anesthesia injection, inject ~0.6-1.0 ml (30-50 mg) 5FU intralesionally per lesion to blanch entire lesion, no more than 2.0 ml (100 mg) to larger SCCs, maximum 250 mg (5 ml) total amount per treatment session. Treat every 2-4 weeks until ulceration or clearance achieved.  Recommend excision of lesions that don't respond after 2 injections.    Potential systemic side effects of 5FU include:  Fatigue, diarrhea, mouth sores, eye irritation, HA, poor appetite      Procedure Note 5-FU Intralesional Injection for malignant destruction SCCs   Location: L mid pretibia x 2 see photo,  Informed Consent: Discussed risks (infection, pain, bleeding, bruising, ulceration, loss of skin pigment, lack of resolution, and recurrence of lesion) and benefits of the procedure, as well as the alternatives. Informed consent was obtained. Preparation: The area was prepared a standard fashion.   Anesthesia:Lidocaine 1% with epinephrine   Procedure Details: An intralesional injection was performed with 5-fluorouracil 50 mg/cc. 2 cc in total were injected to left mid pretibial x 2 lesions   NDC #: [id number] Exp: 11/24   Total number of injections: >7   Return for 2-3 wks follow up on scc.  I, Asher Muir, CMA, am acting as scribe for Jennifer Gardner.   Documentation: I have reviewed the above documentation for accuracy and completeness, and I agree with the above.  Jennifer Gardner

## 2023-01-01 NOTE — Patient Instructions (Signed)

## 2023-01-02 ENCOUNTER — Ambulatory Visit: Payer: Medicare Other | Admitting: Dermatology

## 2023-01-02 MED ORDER — FLUOROURACIL CHEMO INJECTION 500 MG/10ML FOR DERMATOLOGY USE
100.0000 mg | Freq: Once | INTRAVENOUS | Status: AC
  Administered 2023-01-02: 100 mg via INTRALESIONAL

## 2023-01-02 MED ORDER — FLUOROURACIL CHEMO INJECTION 500 MG/10ML FOR DERMATOLOGY USE: 50.0000 mg | Freq: Once | INTRAVENOUS | Status: DC

## 2023-01-02 NOTE — Addendum Note (Signed)
Addended by: Asher Muir A on: 01/02/2023 08:11 AM   Modules accepted: Orders

## 2023-01-15 ENCOUNTER — Ambulatory Visit: Payer: Medicare Other | Admitting: Dermatology

## 2023-01-15 ENCOUNTER — Encounter: Payer: Self-pay | Admitting: Dermatology

## 2023-01-15 VITALS — BP 131/82 | HR 80

## 2023-01-15 DIAGNOSIS — L578 Other skin changes due to chronic exposure to nonionizing radiation: Secondary | ICD-10-CM | POA: Diagnosis not present

## 2023-01-15 DIAGNOSIS — W908XXA Exposure to other nonionizing radiation, initial encounter: Secondary | ICD-10-CM | POA: Diagnosis not present

## 2023-01-15 DIAGNOSIS — C44729 Squamous cell carcinoma of skin of left lower limb, including hip: Secondary | ICD-10-CM

## 2023-01-15 DIAGNOSIS — C4492 Squamous cell carcinoma of skin, unspecified: Secondary | ICD-10-CM

## 2023-01-15 NOTE — Progress Notes (Signed)
   Follow-Up Visit   Subjective  Jennifer Gardner is a 67 y.o. female who presents for the following: left lower leg The patient has spots, moles and lesions to be evaluated, some may be new or changing and the patient may have concern these could be cancer.  Pt here for f/up IL 5FU injection of SCC L pretibia.  Area bubbled up and turned black- had definite reaction to treatment.  Seems better but still healing   The following portions of the chart were reviewed this encounter and updated as appropriate: medications, allergies, medical history  Review of Systems:  No other skin or systemic complaints except as noted in HPI or Assessment and Plan.  Objective  Well appearing patient in no apparent distress; mood and affect are within normal limits.   A focused examination was performed of the following areas: Left lower leg  Relevant exam findings are noted in the Assessment and Plan.     Assessment & Plan   SCC VS RECURRENT SCC VS OTHER Exam:  erythematous patch with central focal eschar- improvement with flattening at left mid pretibia, Photos today    Treatment Plan: Condition is improving with treatment. Will not inject 5FU today since still healing.  May inject on follow-up in two weeks if indicated.  Continue to apply daily moisturizer   Will recheck in another 2 weeks    ACTINIC DAMAGE - chronic, secondary to cumulative UV radiation exposure/sun exposure over time - diffuse scaly erythematous macules with underlying dyspigmentation - Recommend daily broad spectrum sunscreen SPF 30+ to sun-exposed areas, reapply every 2 hours as needed.  - Recommend staying in the shade or wearing long sleeves, sun glasses (UVA+UVB protection) and wide brim hats (4-inch brim around the entire circumference of the hat). - Call for new or changing lesions.    Return for scc follow up .  I, Asher Muir, CMA, am acting as scribe for [name], MD.   Documentation: I have  reviewed the above documentation for accuracy and completeness, and I agree with the above.  [name], MD

## 2023-01-15 NOTE — Patient Instructions (Addendum)
Due to recent changes in healthcare laws, you may see results of your pathology and/or laboratory studies on MyChart before the doctors have had a chance to review them. We understand that in some cases there may be results that are confusing or concerning to you. Please understand that not all results are received at the same time and often the doctors may need to interpret multiple results in order to provide you with the best plan of care or course of treatment. Therefore, we ask that you please give [place] 2 business days to thoroughly review all your results before contacting the office for clarification. Should we see a critical lab result, you will be contacted sooner.   If You Need Anything After Your Visit  If you have any questions or concerns for your doctor, please call our main line at [phone] and press option 4 to reach your doctor's medical assistant. If no one answers, please leave a voicemail as directed and we will return your call as soon as possible. Messages left after 4 pm will be answered the following business day.   You may also send [place] a message via MyChart. We typically respond to MyChart messages within 1-2 business days.  For prescription refills, please ask your pharmacy to contact our office. Our fax number is [phone].  If you have an urgent issue when the clinic is closed that cannot wait until the next business day, you can page your doctor at the number below.    Please note that while we do our best to be available for urgent issues outside of office hours, we are not available 24/7.   If you have an urgent issue and are unable to reach [place], you may choose to seek medical care at your doctor's office, retail clinic, urgent care center, or emergency room.  If you have a medical emergency, please immediately call 911 or go to the emergency department.  Pager Numbers  - Dr. Gwen Pounds: [phone]  - Dr. Roseanne Reno: [phone]  In the event of inclement  weather, please call our main line at [phone] for an update on the status of any delays or closures.  Dermatology Medication Tips: Please keep the boxes that topical medications come in in order to help keep track of the instructions about where and how to use these. Pharmacies typically print the medication instructions only on the boxes and not directly on the medication tubes.   If your medication is too expensive, please contact our office at [phone] option 4 or send [place] a message through MyChart.   We are unable to tell what your co-pay for medications will be in advance as this is different depending on your insurance coverage. However, we may be able to find a substitute medication at lower cost or fill out paperwork to get insurance to cover a needed medication.   If a prior authorization is required to get your medication covered by your insurance company, please allow [place] 1-2 business days to complete this process.  Drug prices often vary depending on where the prescription is filled and some pharmacies may offer cheaper prices.  The website www.goodrx.com contains coupons for medications through different pharmacies. The prices here do not account for what the cost may be with help from insurance (it may be cheaper with your insurance), but the website can give you the price if you did not use any insurance.  - You can print the associated coupon and take it with your prescription to the pharmacy.  -  You may also stop by our office during regular business hours and pick up a GoodRx coupon card.  - If you need your prescription sent electronically to a different pharmacy, notify our office through [facility] or by phone at [phone] option 4.     Si Usted Necesita Algo Despus de Su Visita  Tambin puede enviarnos un mensaje a travs de Clinical cytogeneticist. Por lo general respondemos a los mensajes de MyChart en el transcurso de 1 a 2 das hbiles.  Para renovar recetas,  por favor pida a su farmacia que se ponga en contacto con nuestra oficina. Annie Sable de fax es [place] [phone].  Si tiene un asunto urgente cuando la clnica est cerrada y que no puede esperar hasta el siguiente da hbil, puede llamar/localizar a su doctor(a) al nmero que aparece a continuacin.   Por favor, tenga en cuenta que aunque hacemos todo lo posible para estar disponibles para asuntos urgentes fuera del horario de [place], no estamos disponibles las 24 horas del da, los 7 [address]  [address] de la [place].   Si tiene un problema urgente y no puede comunicarse con nosotros, puede optar por buscar atencin mdica  en el consultorio de su doctor(a), en una clnica privada, en un centro de atencin urgente o en una sala de emergencias.  Si tiene Engineer, drilling, por favor llame inmediatamente al 911 o vaya a la sala de emergencias.  Nmeros de bper  - Dr. Gwen Pounds: [phone]  - Dra. Moye: [phone]  - Dra. Roseanne Reno: [phone]  En caso de inclemencias del [place], por favor llame a Lacy Duverney principal al [phone] para una actualizacin sobre el [place] de cualquier retraso o cierre.  Consejos para la medicacin en dermatologa: Por favor, guarde las cajas en las que vienen los medicamentos de uso tpico para ayudarle a seguir las instrucciones sobre dnde y cmo usarlos. Las farmacias generalmente imprimen las instrucciones del medicamento slo en las cajas y no directamente en los tubos del [place].   Si su medicamento es muy caro, por favor, pngase en contacto con Rolm Gala llamando al [phone] y presione la opcin 4 o envenos un mensaje a travs de Clinical cytogeneticist.   No podemos decirle cul ser su copago por los medicamentos por adelantado ya que esto es diferente dependiendo de la cobertura de su seguro. Sin embargo, es posible que podamos encontrar un medicamento sustituto a Audiological scientist un formulario para que el seguro cubra el medicamento que se  considera necesario.   Si se requiere una autorizacin previa para que su compaa de seguros [place] su medicamento, por favor permtanos de 1 a 2 das hbiles para completar [address]  Los precios de los medicamentos varan con frecuencia dependiendo del Environmental consultant de dnde se surte la receta y alguna farmacias pueden ofrecer precios ms baratos.  El sitio web www.goodrx.com tiene cupones para medicamentos de Health and safety inspector. Los precios aqu no tienen en cuenta lo que podra costar con la ayuda del seguro (puede ser ms barato con su seguro), pero el sitio web puede darle el precio si no utiliz Tourist information centre manager.  - Puede imprimir el cupn correspondiente y llevarlo con su receta a la farmacia.  - Tambin puede pasar por nuestra oficina durante el horario de atencin regular y Education officer, museum una tarjeta de cupones de GoodRx.  - Si necesita que su receta se enve electrnicamente a Psychiatrist, informe a nuestra oficina a travs de MyChart de [place] o por telfono llamando al [phone] y  presione la opcin 4.      Due to recent changes in healthcare laws, you may see results of your pathology and/or laboratory studies on MyChart before the doctors have had a chance to review them. We understand that in some cases there may be results that are confusing or concerning to you. Please understand that not all results are received at the same time and often the doctors may need to interpret multiple results in order to provide you with the best plan of care or course of treatment. Therefore, we ask that you please give [place] 2 business days to thoroughly review all your results before contacting the office for clarification. Should we see a critical lab result, you will be contacted sooner.   If You Need Anything After Your Visit  If you have any questions or concerns for your doctor, please call our main line at [phone] and press option 4 to reach your doctor's medical assistant. If no  one answers, please leave a voicemail as directed and we will return your call as soon as possible. Messages left after 4 pm will be answered the following business day.   You may also send [place] a message via MyChart. We typically respond to MyChart messages within 1-2 business days.  For prescription refills, please ask your pharmacy to contact our office. Our fax number is [phone].  If you have an urgent issue when the clinic is closed that cannot wait until the next business day, you can page your doctor at the number below.    Please note that while we do our best to be available for urgent issues outside of office hours, we are not available 24/7.   If you have an urgent issue and are unable to reach [place], you may choose to seek medical care at your doctor's office, retail clinic, urgent care center, or emergency room.  If you have a medical emergency, please immediately call 911 or go to the emergency department.  Pager Numbers  - Dr. Gwen Pounds: [phone]  - Dr. Roseanne Reno: [phone]  In the event of inclement weather, please call our main line at [phone] for an update on the status of any delays or closures.  Dermatology Medication Tips: Please keep the boxes that topical medications come in in order to help keep track of the instructions about where and how to use these. Pharmacies typically print the medication instructions only on the boxes and not directly on the medication tubes.   If your medication is too expensive, please contact our office at [phone] option 4 or send [place] a message through MyChart.   We are unable to tell what your co-pay for medications will be in advance as this is different depending on your insurance coverage. However, we may be able to find a substitute medication at lower cost or fill out paperwork to get insurance to cover a needed medication.   If a prior authorization is required to get your medication covered by your insurance  company, please allow [place] 1-2 business days to complete this process.  Drug prices often vary depending on where the prescription is filled and some pharmacies may offer cheaper prices.  The website www.goodrx.com contains coupons for medications through different pharmacies. The prices here do not account for what the cost may be with help from insurance (it may be cheaper with your insurance), but the website can give you the price if you did not use any insurance.  - You can print the associated  coupon and take it with your prescription to the pharmacy.  - You may also stop by our office during regular business hours and pick up a GoodRx coupon card.  - If you need your prescription sent electronically to a different pharmacy, notify our office through [facility] or by phone at [phone] option 4.     Si Usted Necesita Algo Despus de Su Visita  Tambin puede enviarnos un mensaje a travs de Clinical cytogeneticist. Por lo general respondemos a los mensajes de MyChart en el transcurso de 1 a 2 das hbiles.  Para renovar recetas, por favor pida a su farmacia que se ponga en contacto con nuestra oficina. Annie Sable de fax es [place] [phone].  Si tiene un asunto urgente cuando la clnica est cerrada y que no puede esperar hasta el siguiente da hbil, puede llamar/localizar a su doctor(a) al nmero que aparece a continuacin.   Por favor, tenga en cuenta que aunque hacemos todo lo posible para estar disponibles para asuntos urgentes fuera del horario de [place], no estamos disponibles las 24 horas del da, los 7 [address]  [address] de la [place].   Si tiene un problema urgente y no puede comunicarse con nosotros, puede optar por buscar atencin mdica  en el consultorio de su doctor(a), en una clnica privada, en un centro de atencin urgente o en una sala de emergencias.  Si tiene Engineer, drilling, por favor llame inmediatamente al 911 o vaya a la sala de emergencias.  Nmeros de bper  - Dr.  Gwen Pounds: [phone]  - Dra. Moye: [phone]  - Dra. Roseanne Reno: [phone]  En caso de inclemencias del [place], por favor llame a Lacy Duverney principal al [phone] para una actualizacin sobre el [place] de cualquier retraso o cierre.  Consejos para la medicacin en dermatologa: Por favor, guarde las cajas en las que vienen los medicamentos de uso tpico para ayudarle a seguir las instrucciones sobre dnde y cmo usarlos. Las farmacias generalmente imprimen las instrucciones del medicamento slo en las cajas y no directamente en los tubos del [place].   Si su medicamento es muy caro, por favor, pngase en contacto con Rolm Gala llamando al [phone] y presione la opcin 4 o envenos un mensaje a travs de Clinical cytogeneticist.   No podemos decirle cul ser su copago por los medicamentos por adelantado ya que esto es diferente dependiendo de la cobertura de su seguro. Sin embargo, es posible que podamos encontrar un medicamento sustituto a Audiological scientist un formulario para que el seguro cubra el medicamento que se considera necesario.   Si se requiere una autorizacin previa para que su compaa de seguros [place] su medicamento, por favor permtanos de 1 a 2 das hbiles para completar [address]  Los precios de los medicamentos varan con frecuencia dependiendo del Environmental consultant de dnde se surte la receta y alguna farmacias pueden ofrecer precios ms baratos.  El sitio web www.goodrx.com tiene cupones para medicamentos de Health and safety inspector. Los precios aqu no tienen en cuenta lo que podra costar con la ayuda del seguro (puede ser ms barato con su seguro), pero el sitio web puede darle el precio si no utiliz Tourist information centre manager.  - Puede imprimir el cupn correspondiente y llevarlo con su receta a la farmacia.  - Tambin puede pasar por nuestra oficina durante el horario de atencin regular y Education officer, museum una tarjeta de cupones de GoodRx.  - Si necesita que su receta se enve  electrnicamente a Psychiatrist, informe a nuestra oficina a travs  de MyChart de [place] o por telfono llamando al [phone] y presione la opcin 4.

## 2023-01-27 ENCOUNTER — Ambulatory Visit: Payer: Medicare Other | Admitting: Dermatology

## 2023-01-27 ENCOUNTER — Encounter: Payer: Self-pay | Admitting: Dermatology

## 2023-01-27 DIAGNOSIS — C44729 Squamous cell carcinoma of skin of left lower limb, including hip: Secondary | ICD-10-CM

## 2023-01-27 DIAGNOSIS — Z85828 Personal history of other malignant neoplasm of skin: Secondary | ICD-10-CM | POA: Diagnosis not present

## 2023-01-27 DIAGNOSIS — L578 Other skin changes due to chronic exposure to nonionizing radiation: Secondary | ICD-10-CM

## 2023-01-27 DIAGNOSIS — L570 Actinic keratosis: Secondary | ICD-10-CM

## 2023-01-27 DIAGNOSIS — W908XXA Exposure to other nonionizing radiation, initial encounter: Secondary | ICD-10-CM | POA: Diagnosis not present

## 2023-01-27 NOTE — Progress Notes (Signed)
   Follow-Up Visit   Subjective  Jennifer Gardner is a 67 y.o. female who presents for the following: Recheck SCC vs recurrent SCC vs other of left pretibial - treated with 5FU injection.   The patient has spots, moles and lesions to be evaluated, some may be new or changing and the patient may have concern these could be cancer.   The following portions of the chart were reviewed this encounter and updated as appropriate: medications, allergies, medical history  Review of Systems:  No other skin or systemic complaints except as noted in HPI or Assessment and Plan.  Objective  Well appearing patient in no apparent distress; mood and affect are within normal limits.   A focused examination was performed of the following areas: bilateral lower legs  Relevant exam findings are noted in the Assessment and Plan.  Right upper pretibia (2) Erythematous thin papules/macules with gritty scale.     Assessment & Plan   SCC vs Recurrent SCC vs other of left pretibial  Exam: violaceous patch with slight induration/scale central      Treatment Plan: Clear post 5FU injections. Observe for recurrence. Call clinic for new or changing lesions.  Recommend regular skin exams, daily broad-spectrum spf 30+ sunscreen use, and photoprotection.     No further treatment today. We observe for changes or recurrence.    AK (actinic keratosis) (2) Right upper pretibia  Destruction of lesion - Right upper pretibia (2) Complexity: simple   Destruction method: cryotherapy   Informed consent: discussed and consent obtained   Timeout:  patient name, date of birth, surgical site, and procedure verified Lesion destroyed using liquid nitrogen: Yes   Region frozen until ice ball extended beyond lesion: Yes   Outcome: patient tolerated procedure well with no complications   Post-procedure details: wound care instructions given     ACTINIC DAMAGE - chronic, secondary to cumulative UV radiation  exposure/sun exposure over time - diffuse scaly erythematous macules with underlying dyspigmentation - Recommend daily broad spectrum sunscreen SPF 30+ to sun-exposed areas, reapply every 2 hours as needed.  - Recommend staying in the shade or wearing long sleeves, sun glasses (UVA+UVB protection) and wide brim hats (4-inch brim around the entire circumference of the hat). - Call for new or changing lesions.   Return for Follow up as scheduled.  I, Joanie Coddington, CMA, am acting as scribe for [name], MD .   Documentation: I have reviewed the above documentation for accuracy and completeness, and I agree with the above.  [name], MD

## 2023-01-27 NOTE — Patient Instructions (Signed)

## 2023-03-06 ENCOUNTER — Other Ambulatory Visit: Payer: Self-pay | Admitting: Physician Assistant

## 2023-03-06 DIAGNOSIS — Z1231 Encounter for screening mammogram for malignant neoplasm of breast: Secondary | ICD-10-CM

## 2023-03-10 ENCOUNTER — Ambulatory Visit
Admission: RE | Admit: 2023-03-10 | Discharge: 2023-03-10 | Disposition: A | Discharge status: Home / Self Care | Payer: Medicare Other | Source: Ambulatory Visit | Attending: Physician Assistant | Admitting: Physician Assistant

## 2023-03-10 DIAGNOSIS — Z1231 Encounter for screening mammogram for malignant neoplasm of breast: Secondary | ICD-10-CM | POA: Insufficient documentation

## 2023-04-28 ENCOUNTER — Ambulatory Visit: Payer: Medicare Other | Admitting: Dermatology

## 2023-04-28 DIAGNOSIS — L814 Other melanin hyperpigmentation: Secondary | ICD-10-CM

## 2023-04-28 DIAGNOSIS — L570 Actinic keratosis: Secondary | ICD-10-CM

## 2023-04-28 DIAGNOSIS — D229 Melanocytic nevi, unspecified: Secondary | ICD-10-CM

## 2023-04-28 DIAGNOSIS — L821 Other seborrheic keratosis: Secondary | ICD-10-CM

## 2023-04-28 DIAGNOSIS — Z85828 Personal history of other malignant neoplasm of skin: Secondary | ICD-10-CM

## 2023-04-28 DIAGNOSIS — D2262 Melanocytic nevi of left upper limb, including shoulder: Secondary | ICD-10-CM

## 2023-04-28 DIAGNOSIS — L565 Disseminated superficial actinic porokeratosis (DSAP): Secondary | ICD-10-CM

## 2023-04-28 DIAGNOSIS — Z1283 Encounter for screening for malignant neoplasm of skin: Secondary | ICD-10-CM | POA: Diagnosis not present

## 2023-04-28 DIAGNOSIS — W908XXA Exposure to other nonionizing radiation, initial encounter: Secondary | ICD-10-CM

## 2023-04-28 DIAGNOSIS — L578 Other skin changes due to chronic exposure to nonionizing radiation: Secondary | ICD-10-CM | POA: Diagnosis not present

## 2023-04-28 DIAGNOSIS — D1801 Hemangioma of skin and subcutaneous tissue: Secondary | ICD-10-CM

## 2023-04-28 DIAGNOSIS — Z86018 Personal history of other benign neoplasm: Secondary | ICD-10-CM

## 2023-04-28 NOTE — Patient Instructions (Addendum)
Cryotherapy Aftercare  Wash gently with soap and water everyday.   Apply Vaseline and Band-Aid daily until healed.   Instructions for [facility] Medications  One or more of your medications was sent to the [facility] mail order compounding pharmacy. You will receive an email from them and can purchase the medicine through that link. It will then be mailed to your home at the address you confirmed. If for any reason you do not receive an email from them, please check your spam folder. If you still do not find the email, please let us know. [facility] phone number is [phone].     Due to recent changes in healthcare laws, you may see results of your pathology and/or laboratory studies on MyChart before the doctors have had a chance to review them. We understand that in some cases there may be results that are confusing or concerning to you. Please understand that not all results are received at the same time and often the doctors may need to interpret multiple results in order to provide you with the best plan of care or course of treatment. Therefore, we ask that you please give [place] 2 business days to thoroughly review all your results before contacting the office for clarification. Should we see a critical lab result, you will be contacted sooner.   If You Need Anything After Your Visit  If you have any questions or concerns for your doctor, please call our main line at [phone] and press option 4 to reach your doctor's medical assistant. If no one answers, please leave a voicemail as directed and we will return your call as soon as possible. Messages left after 4 pm will be answered the following business day.   You may also send [place] a message via MyChart. We typically respond to MyChart messages within 1-2 business days.  For prescription refills, please ask your pharmacy to contact our office. Our fax number is [phone].  If you have an urgent issue when the clinic  is closed that cannot wait until the next business day, you can page your doctor at the number below.    Please note that while we do our best to be available for urgent issues outside of office hours, we are not available 24/7.   If you have an urgent issue and are unable to reach [place], you may choose to seek medical care at your doctor's office, retail clinic, urgent care center, or emergency room.  If you have a medical emergency, please immediately call 911 or go to the emergency department.  Pager Numbers  - Dr. Gwen Pounds: [phone]  - Dr. Roseanne Reno: [phone]  - Dr. Katrinka Blazing: [phone]   In the event of inclement weather, please call our main line at [phone] for an update on the status of any delays or closures.  Dermatology Medication Tips: Please keep the boxes that topical medications come in in order to help keep track of the instructions about where and how to use these. Pharmacies typically print the medication instructions only on the boxes and not directly on the medication tubes.   If your medication is too expensive, please contact our office at [phone] option 4 or send [place] a message through MyChart.   We are unable to tell what your co-pay for medications will be in advance as this is different depending on your insurance coverage. However, we may be able to find a substitute medication at lower cost or fill out paperwork to get insurance to  cover a needed medication.   If a prior authorization is required to get your medication covered by your insurance company, please allow [place] 1-2 business days to complete this process.  Drug prices often vary depending on where the prescription is filled and some pharmacies may offer cheaper prices.  The website www.goodrx.com contains coupons for medications through different pharmacies. The prices here do not account for what the cost may be with help from insurance (it may be cheaper with your insurance), but the website  can give you the price if you did not use any insurance.  - You can print the associated coupon and take it with your prescription to the pharmacy.  - You may also stop by our office during regular business hours and pick up a GoodRx coupon card.  - If you need your prescription sent electronically to a different pharmacy, notify our office through [facility] or by phone at [phone] option 4.     Si Usted Necesita Algo Despus de Su Visita  Tambin puede enviarnos un mensaje a travs de Clinical cytogeneticist. Por lo general respondemos a los mensajes de MyChart en el transcurso de 1 a 2 das hbiles.  Para renovar recetas, por favor pida a su farmacia que se ponga en contacto con nuestra oficina. Annie Sable de fax es [place] [phone].  Si tiene un asunto urgente cuando la clnica est cerrada y que no puede esperar hasta el siguiente da hbil, puede llamar/localizar a su doctor(a) al nmero que aparece a continuacin.   Por favor, tenga en cuenta que aunque hacemos todo lo posible para estar disponibles para asuntos urgentes fuera del horario de [place], no estamos disponibles las 24 horas del da, los 7 [address]  [address] de la [place].   Si tiene un problema urgente y no puede comunicarse con nosotros, puede optar por buscar atencin mdica  en el consultorio de su doctor(a), en una clnica privada, en un centro de atencin urgente o en una sala de emergencias.  Si tiene Engineer, drilling, por favor llame inmediatamente al 911 o vaya a la sala de emergencias.  Nmeros de bper  - Dr. Gwen Pounds: [phone]  - Dra. Roseanne Reno: [phone]  - Dr. Katrinka Blazing: [phone]   En caso de inclemencias del tiempo, por favor llame a Lacy Duverney principal al [phone] para una actualizacin sobre el [place] de cualquier retraso o cierre.  Consejos para la medicacin en dermatologa: Por favor, guarde las cajas en las que vienen los medicamentos de uso tpico para ayudarle a seguir las instrucciones  sobre dnde y cmo usarlos. Las farmacias generalmente imprimen las instrucciones del medicamento slo en las cajas y no directamente en los tubos del [place].   Si su medicamento es muy caro, por favor, pngase en contacto con Rolm Gala llamando al [phone] y presione la opcin 4 o envenos un mensaje a travs de Clinical cytogeneticist.   No podemos decirle cul ser su copago por los medicamentos por adelantado ya que esto es diferente dependiendo de la cobertura de su seguro. Sin embargo, es posible que podamos encontrar un medicamento sustituto a Audiological scientist un formulario para que el seguro cubra el medicamento que se considera necesario.   Si se requiere una autorizacin previa para que su compaa de seguros [place] su medicamento, por favor permtanos de 1 a 2 das hbiles para completar [address]  Los precios de los medicamentos varan con frecuencia dependiendo del Environmental consultant de dnde se surte la receta y alguna farmacias pueden ofrecer precios  ms baratos.  El sitio web www.goodrx.com tiene cupones para medicamentos de Health and safety inspector. Los precios aqu no tienen en cuenta lo que podra costar con la ayuda del seguro (puede ser ms barato con su seguro), pero el sitio web puede darle el precio si no utiliz Tourist information centre manager.  - Puede imprimir el cupn correspondiente y llevarlo con su receta a la farmacia.  - Tambin puede pasar por nuestra oficina durante el horario de atencin regular y Education officer, museum una tarjeta de cupones de GoodRx.  - Si necesita que su receta se enve electrnicamente a una farmacia diferente, informe a nuestra oficina a travs de MyChart de [place] o por telfono llamando al [phone] y presione la opcin 4.

## 2023-04-28 NOTE — Progress Notes (Signed)
Follow-Up Visit   Subjective  Jennifer Gardner is a 67 y.o. female who presents for the following: Skin Cancer Screening and Full Body Skin Exam AK f/u R upper pretibial, hx of BCCs, SCCs, Dysplastic Nevus, Aks, DSAP legs, arms, chest, SM cholesterol cr bid but hasn't used for several months due to her knee surgery, scaly spot forehead comes and goes  The patient presents for Total-Body Skin Exam (TBSE) for skin cancer screening and mole check. The patient has spots, moles and lesions to be evaluated, some may be new or changing and the patient may have concern these could be cancer.    The following portions of the chart were reviewed this encounter and updated as appropriate: medications, allergies, medical history  Review of Systems:  No other skin or systemic complaints except as noted in HPI or Assessment and Plan.  Objective  Well appearing patient in no apparent distress; mood and affect are within normal limits.  A full examination was performed including scalp, head, eyes, ears, nose, lips, neck, chest, axillae, abdomen, back, buttocks, bilateral upper extremities, bilateral lower extremities, hands, feet, fingers, toes, fingernails, and toenails. All findings within normal limits unless otherwise noted below.   Relevant physical exam findings are noted in the Assessment and Plan.  R ant thigh x 3, R pretibia x 1, L medial knee x 2, L lat thigh x 1, L hand dorsum x 2, R breast x 1, mid forehead x 1 (11) Pink keratotic macules    Assessment & Plan   SKIN CANCER SCREENING PERFORMED TODAY.  ACTINIC DAMAGE - Chronic condition, secondary to cumulative UV/sun exposure - diffuse scaly erythematous macules with underlying dyspigmentation - Recommend daily broad spectrum sunscreen SPF 30+ to sun-exposed areas, reapply every 2 hours as needed.  - Staying in the shade or wearing long sleeves, sun glasses (UVA+UVB protection) and wide brim hats (4-inch brim around the entire  circumference of the hat) are also recommended for sun protection.  - Call for new or changing lesions.  LENTIGINES, SEBORRHEIC KERATOSES, HEMANGIOMAS - Benign normal skin lesions - Benign-appearing - Call for any changes  MELANOCYTIC NEVI - Tan-brown and/or pink-flesh-colored symmetric macules and papules - L post shoulder flesh/tan papules x 3 - Benign appearing on exam today - Observation - Call clinic for new or changing moles - Recommend daily use of broad spectrum spf 30+ sunscreen to sun-exposed areas.    HISTORY OF BASAL CELL CARCINOMA OF THE SKIN - No evidence of recurrence today- R upper forehead, R ant shoulder - Recommend regular full body skin exams - Recommend daily broad spectrum sunscreen SPF 30+ to sun-exposed areas, reapply every 2 hours as needed.  - Call if any new or changing lesions are noted between office visits    HISTORY OF SQUAMOUS CELL CARCINOMA OF THE SKIN - No evidence of recurrence today- multiple,  L pretibia clear after 5FU injections - Recommend regular full body skin exams - Recommend daily broad spectrum sunscreen SPF 30+ to sun-exposed areas, reapply every 2 hours as needed.  - Call if any new or changing lesions are noted between office visits   HISTORY OF DYSPLASTIC NEVUS No evidence of recurrence today- Mid back Recommend regular full body skin exams Recommend daily broad spectrum sunscreen SPF 30+ to sun-exposed areas, reapply every 2 hours as needed.  Call if any new or changing lesions are noted between office visits    DSAP (disseminated superficial actinic porokeratosis)  Bil arms, bil legs, chest  Chronic  and persistent condition with duration or expected duration over one year. Condition is bothersome/symptomatic for patient. Currently flared.   DSAP is a chronic inherited condition of sun-exposed skin, most commonly affecting the arms and legs.  It is difficult to treat.  Recommend photoprotection and regular use of spf 30 or  higher sunscreen to prevent worsening of condition and precancerous changes.  Exam: light pink and violaceous/hyperpigmented thin papules mild scale on bilateral legs, arms  Treatment Plan: restart SM Cholesterol 2% Lovastatin 2% Cream 240 gm- Apply twice daily as directed to affected areas arms and legs.     AK (actinic keratosis) (11) R ant thigh x 3, R pretibia x 1, L medial knee x 2, L lat thigh x 1, L hand dorsum x 2, R breast x 1, mid forehead x 1  Actinic keratoses are precancerous spots that appear secondary to cumulative UV radiation exposure/sun exposure over time. They are chronic with expected duration over 1 year. A portion of actinic keratoses will progress to squamous cell carcinoma of the skin. It is not possible to reliably predict which spots will progress to skin cancer and so treatment is recommended to prevent development of skin cancer.  Recommend daily broad spectrum sunscreen SPF 30+ to sun-exposed areas, reapply every 2 hours as needed.  Recommend staying in the shade or wearing long sleeves, sun glasses (UVA+UVB protection) and wide brim hats (4-inch brim around the entire circumference of the hat). Call for new or changing lesions.  Destruction of lesion - R ant thigh x 3, R pretibia x 1, L medial knee x 2, L lat thigh x 1, L hand dorsum x 2, R breast x 1, mid forehead x 1 (11)  Destruction method: cryotherapy   Informed consent: discussed and consent obtained   Lesion destroyed using liquid nitrogen: Yes   Region frozen until ice ball extended beyond lesion: Yes   Outcome: patient tolerated procedure well with no complications   Post-procedure details: wound care instructions given   Additional details:  Prior to procedure, discussed risks of blister formation, small wound, skin dyspigmentation, or rare scar following cryotherapy. Recommend Vaseline ointment to treated areas while healing.    Return in about 6 months (around 10/26/2023) for AK f/u.  I, [name], RMA, am acting as scribe for [name], MD .   Documentation: I have reviewed the above documentation for accuracy and completeness, and I agree with the above.  [name], MD

## 2023-07-21 ENCOUNTER — Ambulatory Visit: Payer: Medicare Other | Admitting: Dermatology

## 2023-07-21 DIAGNOSIS — L299 Pruritus, unspecified: Secondary | ICD-10-CM

## 2023-07-21 DIAGNOSIS — L853 Xerosis cutis: Secondary | ICD-10-CM

## 2023-07-21 DIAGNOSIS — L821 Other seborrheic keratosis: Secondary | ICD-10-CM

## 2023-07-21 DIAGNOSIS — D489 Neoplasm of uncertain behavior, unspecified: Secondary | ICD-10-CM

## 2023-07-21 DIAGNOSIS — W908XXA Exposure to other nonionizing radiation, initial encounter: Secondary | ICD-10-CM

## 2023-07-21 DIAGNOSIS — D0471 Carcinoma in situ of skin of right lower limb, including hip: Secondary | ICD-10-CM

## 2023-07-21 DIAGNOSIS — L578 Other skin changes due to chronic exposure to nonionizing radiation: Secondary | ICD-10-CM

## 2023-07-21 DIAGNOSIS — D492 Neoplasm of unspecified behavior of bone, soft tissue, and skin: Secondary | ICD-10-CM

## 2023-07-21 MED ORDER — TRIAMCINOLONE ACETONIDE 0.1 % EX CREA: TOPICAL_CREAM | CUTANEOUS | 0 refills | Status: DC

## 2023-07-21 NOTE — Progress Notes (Unsigned)
Follow-Up Visit   Subjective  Jennifer Gardner is a 68 y.o. female who presents for the following: patient reports a spot at right leg she noticed a month ago. Patient reports can get sore at times.    The patient has spots, moles and lesions to be evaluated, some may be new or changing and the patient may have concern these could be cancer.   The following portions of the chart were reviewed this encounter and updated as appropriate: medications, allergies, medical history  Review of Systems:  No other skin or systemic complaints except as noted in HPI or Assessment and Plan.  Objective  Well appearing patient in no apparent distress; mood and affect are within normal limits.   A focused examination was performed of the following areas: Back and right leg   Relevant exam findings are noted in the Assessment and Plan.  right medial lower leg 7 mm pink keratotic papule    Assessment & Plan   SEBORRHEIC KERATOSIS - Stuck-on, waxy, tan-brown papules and/or plaques  - Benign-appearing - Discussed benign etiology and prognosis. - Observe - Call for any changes  Xerosis with pruritus at back  - diffuse xerotic patches at back - recommend gentle, hydrating skin care - gentle skin care handout given  Recommend OTC Gold Bond Rapid Relief Anti-Itch cream (pramoxine + menthol), CeraVe Anti-itch cream or lotion (pramoxine), Sarna lotion (Original- menthol + camphor or Sensitive- pramoxine) or Eucerin 12 hour Itch Relief lotion (menthol) up to 3 times per day to areas on body that are itchy.  Start Tmc 0.1% cream - apply topically to itchy areas at back qd/bid prn. Avoid applying to face, groin, and axilla. Use as directed. Long-term use can cause thinning of the skin.  Topical steroids (such as triamcinolone, fluocinolone, fluocinonide, mometasone, clobetasol, halobetasol, betamethasone, hydrocortisone) can cause thinning and lightening of the skin if they are used for too long in  the same area. Your physician has selected the right strength medicine for your problem and area affected on the body. Please use your medication only as directed by your physician to prevent side effects.    ACTINIC DAMAGE - chronic, secondary to cumulative UV radiation exposure/sun exposure over time - diffuse scaly erythematous macules with underlying dyspigmentation - Recommend daily broad spectrum sunscreen SPF 30+ to sun-exposed areas, reapply every 2 hours as needed.  - Recommend staying in the shade or wearing long sleeves, sun glasses (UVA+UVB protection) and wide brim hats (4-inch brim around the entire circumference of the hat). - Call for new or changing lesions.  NEOPLASM OF UNCERTAIN BEHAVIOR right medial lower leg Epidermal / dermal shaving  Lesion diameter (cm):  0.7 Informed consent: discussed and consent obtained   Patient was prepped and draped in usual sterile fashion: Area prepped with alcohol. Anesthesia: the lesion was anesthetized in a standard fashion   Anesthetic:  1% lidocaine w/ epinephrine 1-100,000 buffered w/ 8.4% NaHCO3 Instrument used: flexible razor blade   Hemostasis achieved with: pressure, aluminum chloride and electrodesiccation   Outcome: patient tolerated procedure well    Destruction of lesion  Destruction method: electrodesiccation and curettage   Informed consent: discussed and consent obtained   Curettage performed in three different directions: Yes   Electrodesiccation performed over the curetted area: Yes   Final wound size (cm):  0.9 Hemostasis achieved with:  pressure, aluminum chloride and electrodesiccation Outcome: patient tolerated procedure well with no complications   Post-procedure details: wound care instructions given   Additional details:  Mupirocin ointment and Bandaid applied  Specimen 1 - Surgical pathology Differential Diagnosis: Isk vs hypertrophic ak r/o scc  Check Margins: No Isk vs hypertrophic ak r/o  scc SEBORRHEIC KERATOSIS   XEROSIS CUTIS   PRURITUS    Return for keep follow up as scheduled.  I, Asher Muir, CMA, am acting as scribe for [name], MD.   Documentation: I have reviewed the above documentation for accuracy and completeness, and I agree with the above.  [name], MD

## 2023-07-21 NOTE — Patient Instructions (Addendum)
For itchy areas at back  Recommend OTC Gold Bond Rapid Relief Anti-Itch cream (pramoxine + menthol), CeraVe Anti-itch cream or lotion (pramoxine), Sarna lotion (Original- menthol + camphor or Sensitive- pramoxine) or Eucerin 12 hour Itch Relief lotion (menthol) up to 3 times per day to areas on body that are itchy.  Start triamcinolone 0.1 % cream - apply topically to itchy areas on back one to two times daily as needed.  Avoid applying to face, groin, and axilla. Use as directed. Long-term use can cause thinning of the skin.  Topical steroids (such as triamcinolone, fluocinolone, fluocinonide, mometasone, clobetasol, halobetasol, betamethasone, hydrocortisone) can cause thinning and lightening of the skin if they are used for too long in the same area. Your physician has selected the right strength medicine for your problem and area affected on the body. Please use your medication only as directed by your physician to prevent side effects.   Gentle Skin Care Guide  1. Bathe no more than once a day.  2. Avoid bathing in hot water  3. Use a mild soap like Dove, Vanicream, Cetaphil, CeraVe. Can use Lever 2000 or Cetaphil antibacterial soap  4. Use soap only where you need it. On most days, use it under your arms, between your legs, and on your feet. Let the water rinse other areas unless visibly dirty.  5. When you get out of the bath/shower, use a towel to gently blot your skin dry, don't rub it.  6. While your skin is still a little damp, apply a moisturizing cream such as Vanicream, CeraVe, Cetaphil, Eucerin, Sarna lotion or plain Vaseline Jelly. For hands apply Neutrogena [place] Hand Cream or Excipial Hand Cream.  7. Reapply moisturizer any time you start to itch or feel dry.  8. Sometimes using free and clear laundry detergents can be helpful. Fabric softener sheets should be avoided. Downy Free & Gentle liquid, or any liquid fabric softener that is free of dyes and perfumes, it  acceptable to use  9. If your doctor has given you prescription creams you may apply moisturizers over them       Electrodesiccation and Curettage ("Scrape and Burn") Wound Care Instructions  Leave the original bandage on for 24 hours if possible.  If the bandage becomes soaked or soiled before that time, it is OK to remove it and examine the wound.  A small amount of post-operative bleeding is normal.  If excessive bleeding occurs, remove the bandage, place gauze over the site and apply continuous pressure (no peeking) over the area for 30 minutes. If this does not work, please call our clinic as soon as possible or page your doctor if it is after hours.   Once a day, cleanse the wound with soap and water. It is fine to shower. If a thick crust develops you may use a Q-tip dipped into dilute hydrogen peroxide (mix 1:1 with water) to dissolve it.  Hydrogen peroxide can slow the healing process, so use it only as needed.    After washing, apply petroleum jelly (Vaseline) or an antibiotic ointment if your doctor prescribed one for you, followed by a bandage.    For best healing, the wound should be covered with a layer of ointment at all times. If you are not able to keep the area covered with a bandage to hold the ointment in place, this may mean re-applying the ointment several times a day.  Continue this wound care until the wound has healed and is no longer open.  It may take several weeks for the wound to heal and close.  Itching and mild discomfort is normal during the healing process.  If you have any discomfort, you can take Tylenol (acetaminophen) or ibuprofen as directed on the bottle. (Please do not take these if you have an allergy to them or cannot take them for another reason).  Some redness, tenderness and white or yellow material in the wound is normal healing.  If the area becomes very sore and red, or develops a thick yellow-green material (pus), it may be infected; please notify  us.    Wound healing continues for up to one year following surgery. It is not unusual to experience pain in the scar from time to time during the interval.  If the pain becomes severe or the scar thickens, you should notify the office.    A slight amount of redness in a scar is expected for the first six months.  After six months, the redness will fade and the scar will soften and fade.  The color difference becomes less noticeable with time.  If there are any problems, return for a post-op surgery check at your earliest convenience.  To improve the appearance of the scar, you can use silicone scar gel, cream, or sheets (such as Mederma or Serica) every night for up to one year. These are available over the counter (without a prescription).  Please call our office at [phone] for any questions or concerns.     Due to recent changes in healthcare laws, you may see results of your pathology and/or laboratory studies on MyChart before the doctors have had a chance to review them. We understand that in some cases there may be results that are confusing or concerning to you. Please understand that not all results are received at the same time and often the doctors may need to interpret multiple results in order to provide you with the best plan of care or course of treatment. Therefore, we ask that you please give [place] 2 business days to thoroughly review all your results before contacting the office for clarification. Should we see a critical lab result, you will be contacted sooner.   If You Need Anything After Your Visit  If you have any questions or concerns for your doctor, please call our main line at [phone] and press option 4 to reach your doctor's medical assistant. If no one answers, please leave a voicemail as directed and we will return your call as soon as possible. Messages left after 4 pm will be answered the following business day.   You may also send [place] a message via MyChart.  We typically respond to MyChart messages within 1-2 business days.  For prescription refills, please ask your pharmacy to contact our office. Our fax number is [phone].  If you have an urgent issue when the clinic is closed that cannot wait until the next business day, you can page your doctor at the number below.    Please note that while we do our best to be available for urgent issues outside of office hours, we are not available 24/7.   If you have an urgent issue and are unable to reach [place], you may choose to seek medical care at your doctor's office, retail clinic, urgent care center, or emergency room.  If you have a medical emergency, please immediately call 911 or go to the emergency department.  Pager Numbers  - Dr. Gwen Pounds: [phone]  - Dr. Roseanne Reno: [phone]  -  Dr. Katrinka Blazing: [phone]   In the event of inclement weather, please call our main line at [phone] for an update on the status of any delays or closures.  Dermatology Medication Tips: Please keep the boxes that topical medications come in in order to help keep track of the instructions about where and how to use these. Pharmacies typically print the medication instructions only on the boxes and not directly on the medication tubes.   If your medication is too expensive, please contact our office at [phone] option 4 or send [place] a message through MyChart.   We are unable to tell what your co-pay for medications will be in advance as this is different depending on your insurance coverage. However, we may be able to find a substitute medication at lower cost or fill out paperwork to get insurance to cover a needed medication.   If a prior authorization is required to get your medication covered by your insurance company, please allow [place] 1-2 business days to complete this process.  Drug prices often vary depending on where the prescription is filled and some pharmacies may offer cheaper prices.  The  website www.goodrx.com contains coupons for medications through different pharmacies. The prices here do not account for what the cost may be with help from insurance (it may be cheaper with your insurance), but the website can give you the price if you did not use any insurance.  - You can print the associated coupon and take it with your prescription to the pharmacy.  - You may also stop by our office during regular business hours and pick up a GoodRx coupon card.  - If you need your prescription sent electronically to a different pharmacy, notify our office through [facility] or by phone at [phone] option 4.     Si Usted Necesita Algo Despus de Su Visita  Tambin puede enviarnos un mensaje a travs de Clinical cytogeneticist. Por lo general respondemos a los mensajes de MyChart en el transcurso de 1 a 2 das hbiles.  Para renovar recetas, por favor pida a su farmacia que se ponga en contacto con nuestra oficina. Annie Sable de fax es [place] [phone].  Si tiene un asunto urgente cuando la clnica est cerrada y que no puede esperar hasta el siguiente da hbil, puede llamar/localizar a su doctor(a) al nmero que aparece a continuacin.   Por favor, tenga en cuenta que aunque hacemos todo lo posible para estar disponibles para asuntos urgentes fuera del horario de [place], no estamos disponibles las 24 horas del da, los 7 [address]  [address] de la [place].   Si tiene un problema urgente y no puede comunicarse con nosotros, puede optar por buscar atencin mdica  en el consultorio de su doctor(a), en una clnica privada, en un centro de atencin urgente o en una sala de emergencias.  Si tiene Engineer, drilling, por favor llame inmediatamente al 911 o vaya a la sala de emergencias.  Nmeros de bper  - Dr. Gwen Pounds: [phone]  - Dra. Roseanne Reno: [phone]  - Dr. Katrinka Blazing: [phone]   En caso de inclemencias del tiempo, por favor llame a Lacy Duverney principal al [phone] para una  actualizacin sobre el [place] de cualquier retraso o cierre.  Consejos para la medicacin en dermatologa: Por favor, guarde las cajas en las que vienen los medicamentos de uso tpico para ayudarle a seguir las instrucciones sobre dnde y cmo usarlos. Las farmacias generalmente imprimen las instrucciones del medicamento slo en las cajas y no directamente en  los tubos del medicamento.   Si su medicamento es muy caro, por favor, pngase en contacto con Rolm Gala llamando al [phone] y presione la opcin 4 o envenos un mensaje a travs de Clinical cytogeneticist.   No podemos decirle cul ser su copago por los medicamentos por adelantado ya que esto es diferente dependiendo de la cobertura de su seguro. Sin embargo, es posible que podamos encontrar un medicamento sustituto a Audiological scientist un formulario para que el seguro cubra el medicamento que se considera necesario.   Si se requiere una autorizacin previa para que su compaa de seguros [place] su medicamento, por favor permtanos de 1 a 2 das hbiles para completar [address]  Los precios de los medicamentos varan con frecuencia dependiendo del Environmental consultant de dnde se surte la receta y alguna farmacias pueden ofrecer precios ms baratos.  El sitio web www.goodrx.com tiene cupones para medicamentos de Health and safety inspector. Los precios aqu no tienen en cuenta lo que podra costar con la ayuda del seguro (puede ser ms barato con su seguro), pero el sitio web puede darle el precio si no utiliz Tourist information centre manager.  - Puede imprimir el cupn correspondiente y llevarlo con su receta a la farmacia.  - Tambin puede pasar por nuestra oficina durante el horario de atencin regular y Education officer, museum una tarjeta de cupones de GoodRx.  - Si necesita que su receta se enve electrnicamente a una farmacia diferente, informe a nuestra oficina a travs de MyChart de [place] o por telfono llamando al [phone] y presione la opcin 4.

## 2023-07-24 LAB — SURGICAL PATHOLOGY

## 2023-07-28 ENCOUNTER — Telehealth: Payer: Self-pay

## 2023-07-28 NOTE — Telephone Encounter (Signed)
-----   Message from Artemio Larry sent at 07/28/2023 12:55 PM EST ----- 1. Skin, right medial lower leg :       SQUAMOUS CELL CARCINOMA IN SITU, ASSOCIATED WITH VERRUCA    SCCIS skin cancer with wart- already treated with EDC at time of biopsy  - please call patient

## 2023-07-28 NOTE — Telephone Encounter (Signed)
 Advised patient biopsy of the right medial lower leg was SCC in situ and has been treated with EDC. Patient to keep f/u appt.

## 2023-10-27 ENCOUNTER — Ambulatory Visit: Payer: Medicare Other | Admitting: Dermatology

## 2023-12-10 ENCOUNTER — Ambulatory Visit: Admitting: Dermatology

## 2023-12-24 ENCOUNTER — Ambulatory Visit: Admitting: Dermatology

## 2023-12-24 DIAGNOSIS — L820 Inflamed seborrheic keratosis: Secondary | ICD-10-CM

## 2023-12-24 DIAGNOSIS — W908XXA Exposure to other nonionizing radiation, initial encounter: Secondary | ICD-10-CM | POA: Diagnosis not present

## 2023-12-24 DIAGNOSIS — L570 Actinic keratosis: Secondary | ICD-10-CM | POA: Diagnosis not present

## 2023-12-24 DIAGNOSIS — L565 Disseminated superficial actinic porokeratosis (DSAP): Secondary | ICD-10-CM

## 2023-12-24 DIAGNOSIS — Z85828 Personal history of other malignant neoplasm of skin: Secondary | ICD-10-CM

## 2023-12-24 DIAGNOSIS — L578 Other skin changes due to chronic exposure to nonionizing radiation: Secondary | ICD-10-CM | POA: Diagnosis not present

## 2023-12-24 DIAGNOSIS — Z86007 Personal history of in-situ neoplasm of skin: Secondary | ICD-10-CM

## 2023-12-24 NOTE — Progress Notes (Signed)
 Follow-Up Visit   Subjective  Jennifer Gardner is a 68 y.o. female who presents for the following: AK f/u, patient has not noticed any new spots. Patient with hx DSAP uses SM Cholesterol  2% Lovastatin 2% Cream, applies 1-2 times daily to arms and legs. H/o SCC/BCC  The patient has spots, moles and lesions to be evaluated, some may be new or changing and the patient may have concern these could be cancer.   The following portions of the chart were reviewed this encounter and updated as appropriate: medications, allergies, medical history  Review of Systems:  No other skin or systemic complaints except as noted in HPI or Assessment and Plan.  Objective  Well appearing patient in no apparent distress; mood and affect are within normal limits.  A focused examination was performed of the following areas: Face, arms, legs, chest  Relevant exam findings are noted in the Assessment and Plan.  Legs, arms Pink/tan scaly macules some with keratotic rim R lateral pretibia x1, R upper knee x3 (4) Pink scaly macules R mid back at bra line x1 Stuck on waxy papule with erythema  Assessment & Plan   ACTINIC DAMAGE - chronic, secondary to cumulative UV radiation exposure/sun exposure over time - diffuse scaly erythematous macules with underlying dyspigmentation - Recommend daily broad spectrum sunscreen SPF 30+ to sun-exposed areas, reapply every 2 hours as needed.  - Recommend staying in the shade or wearing long sleeves, sun glasses (UVA+UVB protection) and wide brim hats (4-inch brim around the entire circumference of the hat). - Call for new or changing lesions.  HISTORY OF SQUAMOUS CELL CARCINOMA OF THE SKIN- multiple Left mid pretibia- tx w/ 5FU injection, photo reviewed, clear today. - No evidence of recurrence today - Recommend regular full body skin exams - Recommend daily broad spectrum sunscreen SPF 30+ to sun-exposed areas, reapply every 2 hours as needed.  - Call if any new or  changing lesions are noted between office visits  HISTORY OF SQUAMOUS CELL CARCINOMA IN SITU OF THE SKIN Right med lower leg, EDC- 07/21/2023 - No evidence of recurrence today - Recommend regular full body skin exams - Recommend daily broad spectrum sunscreen SPF 30+ to sun-exposed areas, reapply every 2 hours as needed.  - Call if any new or changing lesions are noted between office visits  DSAP (DISSEMINATED SUPERFICIAL ACTINIC POROKERATOSIS) Legs, arms Chronic and persistent condition with duration or expected duration over one year. Condition is symptomatic/ bothersome to patient. Not currently at goal.   DSAP is a chronic inherited condition of sun-exposed skin, most commonly affecting the arms and legs.  It is difficult to treat.  Recommend photoprotection and regular use of spf 30 or higher sunscreen to prevent worsening of condition and precancerous changes.  Treatment plan: Continue SM Cholesterol  2% Lovastatin 2% Cream 240 gm- Apply twice daily as directed to affected areas legs, once daily to less affected area body.   Related Medications Cholesterol  POWD 1 Application by Does not apply route in the morning and at bedtime. Bid to aa arms and legs AK (ACTINIC KERATOSIS) (4) R lateral pretibia x1, R upper knee x3 (4) Actinic keratoses are precancerous spots that appear secondary to cumulative UV radiation exposure/sun exposure over time. They are chronic with expected duration over 1 year. A portion of actinic keratoses will progress to squamous cell carcinoma of the skin. It is not possible to reliably predict which spots will progress to skin cancer and so treatment is recommended to prevent  development of skin cancer.  Recommend daily broad spectrum sunscreen SPF 30+ to sun-exposed areas, reapply every 2 hours as needed.  Recommend staying in the shade or wearing long sleeves, sun glasses (UVA+UVB protection) and wide brim hats (4-inch brim around the entire circumference of the  hat). Call for new or changing lesions. Destruction of lesion - R lateral pretibia x1, R upper knee x3 (4)  Destruction method: cryotherapy   Informed consent: discussed and consent obtained   Lesion destroyed using liquid nitrogen: Yes   Region frozen until ice ball extended beyond lesion: Yes   Outcome: patient tolerated procedure well with no complications   Post-procedure details: wound care instructions given   Additional details:  Prior to procedure, discussed risks of blister formation, small wound, skin dyspigmentation, or rare scar following cryotherapy. Recommend Vaseline ointment to treated areas while healing.   INFLAMED SEBORRHEIC KERATOSIS R mid back at bra line x1 Symptomatic, irritating, patient would like treated. Destruction of lesion - R mid back at bra line x1  Destruction method: cryotherapy   Informed consent: discussed and consent obtained   Lesion destroyed using liquid nitrogen: Yes   Region frozen until ice ball extended beyond lesion: Yes   Outcome: patient tolerated procedure well with no complications   Post-procedure details: wound care instructions given   Additional details:  Prior to procedure, discussed risks of blister formation, small wound, skin dyspigmentation, or rare scar following cryotherapy. Recommend Vaseline ointment to treated areas while healing.    Return in 6 months (on 06/25/2024) for w/ Dr. Jackquline, AK follow-up, DSAP.  I, Jacquelynn V. Wilfred, CMA, am acting as scribe for Rexene Jackquline, MD .   Documentation: I have reviewed the above documentation for accuracy and completeness, and I agree with the above.  Rexene Jackquline, MD

## 2023-12-24 NOTE — Patient Instructions (Addendum)

## 2024-02-20 ENCOUNTER — Other Ambulatory Visit: Payer: Self-pay | Admitting: Physician Assistant

## 2024-02-20 DIAGNOSIS — Z1231 Encounter for screening mammogram for malignant neoplasm of breast: Secondary | ICD-10-CM

## 2024-03-11 ENCOUNTER — Ambulatory Visit
Admission: RE | Admit: 2024-03-11 | Discharge: 2024-03-11 | Disposition: A | Discharge status: Home / Self Care | Source: Ambulatory Visit | Attending: Physician Assistant | Admitting: Physician Assistant

## 2024-03-11 DIAGNOSIS — Z1231 Encounter for screening mammogram for malignant neoplasm of breast: Secondary | ICD-10-CM | POA: Insufficient documentation

## 2024-05-06 ENCOUNTER — Telehealth: Payer: Self-pay

## 2024-05-06 NOTE — Telephone Encounter (Signed)
 Patient called and left message on nurse vm about needing refills of [facility] cholesterol  cream that she uses for DSAP on her legs. She was seen in July and has a future appt to be seen in January  Submitted refills to [facility].   Called patient and notified her of refills being sent.  Denied No further questions or concerns

## 2024-05-12 ENCOUNTER — Ambulatory Visit

## 2024-05-12 DIAGNOSIS — L02214 Cutaneous abscess of groin: Secondary | ICD-10-CM

## 2024-05-12 DIAGNOSIS — L0291 Cutaneous abscess, unspecified: Secondary | ICD-10-CM

## 2024-05-12 MED ORDER — DOXYCYCLINE MONOHYDRATE 100 MG PO TABS
100.0000 mg | ORAL_TABLET | Freq: Two times a day (BID) | ORAL | 0 refills | Status: AC
Start: 2024-05-12 — End: ?

## 2024-05-12 NOTE — Progress Notes (Signed)
    Subjective   Jennifer Gardner is a 68 y.o. female who presents for the following: Lesion(s) of concern . Patient is established patient   Today patient reports: Cyst in the left groin x1 year; became inflamed last Friday. Lesion started draining about 2 days ago.   Review of Systems:    No other skin or systemic complaints except as noted in HPI or Assessment and Plan.  The following portions of the chart were reviewed this encounter and updated as appropriate: medications, allergies, medical history  Relevant Medical History:  Personal history of non melanoma skin cancer - see medical history for full details   Objective  (SKPE) Well appearing patient in no apparent distress; mood and affect are within normal limits. Examination was performed of the: Focused Exam of: Groin   Examination notable for: Left groin 3cm inflamed abscess  Examination limited by: Undergarments and Patient deferred removal       Assessment & Plan  (SKAP)   Inflamed abscess of L groin   Incision & Drainage: Area prepped and anesthetized. Incision made with 4mm punch  and purulent material expressed. Loculations broken up with hemostat. Abscess cavity irrigated. Patient tolerated procedure well without complication.  Culture: Wound culture obtained and sent for aerobic/anaerobic analysis to guide targeted antibiotic therapy.  Antibiotics: Started on oral antibiotics. Discussed dosing, duration, and precautions.  Wound Care: Instructed on warm compresses, dressing changes, and signs of worsening infection (increasing redness, pain, fever, streaking, expanding swelling).  Follow-up: Return sooner for worsening symptoms; otherwise follow up when culture results return or if no improvement in 48-72 hours.  - Start doxycycline  100mg  orally BID x2 weeks  - Discussed side effects and precautions with doxycycline  including taking with meal, waiting at least 30 minutes before lying down at night, increased sun  sensitivity, and to stop medication if becomes pregnant or breastfeeding  - Aerobic/Anaerobic culture collected in clinic     Procedures, orders, diagnosis for this visit:  ABSCESS Left Inguinal Area Anaerobic and Aerobic Culture - Left Inguinal Area  Incision and Drainage - Left Inguinal Area Location: Left inguinal area  Informed Consent: Discussed risks (permanent scarring, light or dark discoloration, infection, pain, bleeding, bruising, redness, damage to adjacent structures, and recurrence of the lesion) and benefits of the procedure, as well as the alternatives.  Informed consent was obtained.  Preparation: The area was prepped with alcohol.  Anesthesia: Lidocaine  1% with epinephrine  Procedure Details: An incision was made overlying the lesion. The lesion drained clear, mucoid fluid.  A small amount of fluid was drained.    Antibiotic ointment and a sterile pressure dressing were applied. The patient tolerated procedure well.  Total number of lesions drained: 1  Plan: The patient was instructed on post-op care. Recommend OTC analgesia as needed for pain.    Abscess -     Anaerobic and Aerobic Culture -     Incision and Drainage    Return to clinic: Return if symptoms worsen or fail to improve.  I, Emerick Ege, CMA am acting as scribe for Lauraine JAYSON Kanaris, MD.   Documentation: I have reviewed the above documentation for accuracy and completeness, and I agree with the above.  Lauraine JAYSON Kanaris, MD

## 2024-05-12 NOTE — Addendum Note (Signed)
 Addended by: NANCE HUG A on: 05/12/2024 03:04 PM   Modules accepted: Orders

## 2024-05-12 NOTE — Patient Instructions (Addendum)

## 2024-05-19 ENCOUNTER — Ambulatory Visit: Payer: Self-pay

## 2024-05-19 LAB — ANAEROBIC AND AEROBIC CULTURE

## 2024-05-19 NOTE — Telephone Encounter (Signed)
-----   Message from Lauraine JAYSON Kanaris sent at 05/19/2024  8:37 AM EST ----- Please notify patient with below plan: Enterococcus faecalis Abnormal  + swab - suspect contaminant, light growth Can someone check in with patient and see how they are doing after I&D, doxy? If improving - no further tx, thx!  ----- Message ----- From: Interface, [facility] Lab Results In Sent: 05/19/2024   7:37 AM EST To: Lauraine JAYSON Kanaris, MD

## 2024-05-19 NOTE — Telephone Encounter (Signed)
 Discussed culture results with patient. She states she is doing good at this time so no further tx is needed.

## 2024-06-28 ENCOUNTER — Ambulatory Visit: Admitting: Dermatology

## 2024-07-12 ENCOUNTER — Ambulatory Visit

## 2024-07-15 ENCOUNTER — Ambulatory Visit

## 2024-07-15 DIAGNOSIS — W908XXA Exposure to other nonionizing radiation, initial encounter: Secondary | ICD-10-CM | POA: Diagnosis not present

## 2024-07-15 DIAGNOSIS — Z85828 Personal history of other malignant neoplasm of skin: Secondary | ICD-10-CM | POA: Diagnosis not present

## 2024-07-15 DIAGNOSIS — L209 Atopic dermatitis, unspecified: Secondary | ICD-10-CM

## 2024-07-15 DIAGNOSIS — L570 Actinic keratosis: Secondary | ICD-10-CM | POA: Diagnosis not present

## 2024-07-15 DIAGNOSIS — Z872 Personal history of diseases of the skin and subcutaneous tissue: Secondary | ICD-10-CM | POA: Diagnosis not present

## 2024-07-15 DIAGNOSIS — L565 Disseminated superficial actinic porokeratosis (DSAP): Secondary | ICD-10-CM

## 2024-07-15 DIAGNOSIS — Z86018 Personal history of other benign neoplasm: Secondary | ICD-10-CM | POA: Diagnosis not present

## 2024-07-15 MED ORDER — TRIAMCINOLONE ACETONIDE 0.1 % EX OINT: TOPICAL_OINTMENT | CUTANEOUS | 0 refills | Status: AC

## 2024-07-15 NOTE — Patient Instructions (Signed)

## 2024-07-15 NOTE — Progress Notes (Signed)
 "   Subjective   Jennifer Gardner is a 69 y.o. female who presents for the following: Follow up of AK and DSAP. Patient is established patient   Today patient reports: DSAP of the lower legs, pt currently using cholesterol /lovastatin cream and would like to have it in an ointment if available since her skin is so dry.  Pt believes the previously tx Aks have resolved.  Review of Systems:    No other skin or systemic complaints except as noted in HPI or Assessment and Plan.  The following portions of the chart were reviewed this encounter and updated as appropriate: medications, allergies, medical history  Relevant Medical History:  Personal history of non melanoma skin cancer - see medical history for full details  and Personal history of actinic keratosis   Objective  (SKPE) Well appearing patient in no apparent distress; mood and affect are within normal limits. Examination was performed of the: Focused Exam of: the face, R arm, and legs   Examination notable for: Lentigo/lentigines: Scattered pigmented macules that are tan to brown in color and are somewhat non-uniform in shape and concentrated in the sun-exposed areas, Seborrheic Keratosis(es): Stuck-on appearing keratotic papule(s) on the trunk, some  irritated with redness, crusting, edema, and/or partial avulsion, Actinic Damage/Elastosis: chronic sun damage: dyspigmentation, telangiectasia, and wrinkling, Actinic keratosis: Scaly erythematous macule(s) concentrated on sun exposed areas   DSAP lower extremities  Examination limited by: n/a   R leg x 8, L leg x 6 (14) Pink scaly macules  Assessment & Plan  (SKAP)   Disseminated superficial actinic porokeratosis  Chronic and persistent condition with duration or expected duration over one year. Condition is symptomatic and bothersome to patient. Patient is flaring and not currently at treatment goal.  Discussed autosomal dominant condition, heredity, need for both sunshine+ heredity  to make the spots. This type not thought precancerous. But, the same sun that creates these can create skin cancer. - continue Cholesterol  2% Lovastatin 2% Cream 240 gm- Apply twice daily as directed to affected areas arms and legs.  Sent to [facility]  - Recommend daily broad spectrum sunscreen SPF 30+ to arms/legs, reapply every 2 hours as needed and/or photoprotection with clothing. Call for new or changing lesions.    Atopic dermatitis - mild Chronic and persistent condition with duration or expected duration over one year. Condition is symptomatic and bothersome to patient. Patient is flaring and not currently at treatment goal.   - Discussed dry skin care at length, recommended avoidance of fragrances, short showers with luke- warm water, no scrubbing, an unscented moisturizing soap (e.g. Dove sensitive skin) limited to the groin and axillae, and frequent emollient use (Eucerin, Aquaphor, Cerave, Vanicream, Vaseline). start triamcinolone  ointment 0.1% twice daily to affected areas of skin Discussed side effect of potent topical steroids including atrophy, dyspigmentation, striae, telangectasia, folliculitis, loss of skin pigment, hair growth, tachyphylaxis, risk of systemic absorption with missuse.  Personal history of non melanoma skin cancer , actinic keratosis , and dysplastic nevi  - Reviewed medical history for full details  - Reviewed sun protective measures as above - Encouraged full body skin exams  - encouraged FBSE in next 33mo   Procedures, orders, diagnosis for this visit:  AK (ACTINIC KERATOSIS) (14) R leg x 8, L leg x 6 (14) Actinic keratoses are precancerous spots that appear secondary to cumulative UV radiation exposure/sun exposure over time. They are chronic with expected duration over 1 year. A portion of actinic keratoses will progress to  squamous cell carcinoma of the skin. It is not possible to reliably predict which spots will progress to skin cancer and so  treatment is recommended to prevent development of skin cancer.  Recommend daily broad spectrum sunscreen SPF 30+ to sun-exposed areas, reapply every 2 hours as needed.  Recommend staying in the shade or wearing long sleeves, sun glasses (UVA+UVB protection) and wide brim hats (4-inch brim around the entire circumference of the hat). Call for new or changing lesions. - Destruction of lesion - R leg x 8, L leg x 6 (14) Complexity: simple   Destruction method: cryotherapy   Informed consent: discussed and consent obtained   Timeout:  patient name, date of birth, surgical site, and procedure verified Lesion destroyed using liquid nitrogen: Yes   Region frozen until ice ball extended beyond lesion: Yes   Cryo cycles: 1 or 2. Outcome: patient tolerated procedure well with no complications   Post-procedure details: wound care instructions given     AK (actinic keratosis) -     Destruction of lesion  Other orders -     Triamcinolone  Acetonide; Apply 1 gram twice daily to affected areas of skin. Stop once resolved and restart as needed for flares. Avoid use on face, armpits, groin unless otherwise indicated.  Dispense: 80 g; Refill: 0    Was sun protection counseling provided?: Yes   Level of service outlined above   Return to clinic: Return in about 4 months (around 11/12/2024) for TBSE - hx SCC, AK, DSAP.  LILLETTE Rosina Mayans, CMA, am acting as scribe for Lauraine JAYSON Kanaris, MD .  Documentation: I have reviewed the above documentation for accuracy and completeness, and I agree with the above.  Lauraine JAYSON Kanaris, MD  "

## 2024-11-15 ENCOUNTER — Ambulatory Visit
